# Patient Record
Sex: Female | Born: 1980 | Race: White | Hispanic: No | Marital: Married | Smoking: Never smoker
Health system: Southern US, Community
[De-identification: ages and names within clinical notes are randomized; demographics above are authoritative.]

## PROBLEM LIST (undated history)

## (undated) DIAGNOSIS — J45909 Unspecified asthma, uncomplicated: Secondary | ICD-10-CM

## (undated) DIAGNOSIS — I499 Cardiac arrhythmia, unspecified: Secondary | ICD-10-CM

## (undated) DIAGNOSIS — I471 Supraventricular tachycardia, unspecified: Secondary | ICD-10-CM

## (undated) DIAGNOSIS — R87619 Unspecified abnormal cytological findings in specimens from cervix uteri: Secondary | ICD-10-CM

## (undated) DIAGNOSIS — D72819 Decreased white blood cell count, unspecified: Secondary | ICD-10-CM

## (undated) DIAGNOSIS — M3500 Sicca syndrome, unspecified: Secondary | ICD-10-CM

## (undated) DIAGNOSIS — D6949 Other primary thrombocytopenia: Secondary | ICD-10-CM

## (undated) DIAGNOSIS — IMO0002 Reserved for concepts with insufficient information to code with codable children: Secondary | ICD-10-CM

## (undated) HISTORY — PX: CERVICAL BIOPSY: SHX590

## (undated) HISTORY — DX: Unspecified asthma, uncomplicated: J45.909

## (undated) HISTORY — DX: Sjogren syndrome, unspecified: M35.00

## (undated) HISTORY — PX: WISDOM TOOTH EXTRACTION: SHX21

## (undated) HISTORY — DX: Decreased white blood cell count, unspecified: D72.819

## (undated) HISTORY — DX: Cardiac arrhythmia, unspecified: I49.9

## (undated) HISTORY — PX: TONSILLECTOMY: SUR1361

## (undated) HISTORY — DX: Supraventricular tachycardia, unspecified: I47.10

---

## 2008-07-25 ENCOUNTER — Inpatient Hospital Stay (HOSPITAL_COMMUNITY): Admission: AD | Admit: 2008-07-25 | Discharge: 2008-07-28 | Payer: Self-pay | Admitting: Obstetrics and Gynecology

## 2010-06-27 ENCOUNTER — Ambulatory Visit: Payer: Self-pay | Admitting: Oncology

## 2010-06-30 LAB — CBC WITH DIFFERENTIAL/PLATELET
BASO%: 0.4 % (ref 0.0–2.0)
Basophils Absolute: 0 10*3/uL (ref 0.0–0.1)
EOS%: 0.1 % (ref 0.0–7.0)
Eosinophils Absolute: 0 10*3/uL (ref 0.0–0.5)
HCT: 37.9 % (ref 34.8–46.6)
HGB: 13 g/dL (ref 11.6–15.9)
LYMPH%: 44.3 % (ref 14.0–49.7)
MCH: 28.7 pg (ref 25.1–34.0)
MCHC: 34.3 g/dL (ref 31.5–36.0)
MCV: 83.7 fL (ref 79.5–101.0)
MONO#: 0.3 10*3/uL (ref 0.1–0.9)
MONO%: 11.5 % (ref 0.0–14.0)
NEUT#: 1.1 10*3/uL — ABNORMAL LOW (ref 1.5–6.5)
NEUT%: 43.7 % (ref 38.4–76.8)
Platelets: 141 10*3/uL — ABNORMAL LOW (ref 145–400)
RBC: 4.53 10*6/uL (ref 3.70–5.45)
RDW: 11.4 % (ref 11.2–14.5)
WBC: 2.4 10*3/uL — ABNORMAL LOW (ref 3.9–10.3)
lymph#: 1.1 10*3/uL (ref 0.9–3.3)

## 2010-06-30 LAB — FERRITIN: Ferritin: 37 ng/mL (ref 10–291)

## 2010-06-30 LAB — IRON AND TIBC
%SAT: 8 % — ABNORMAL LOW (ref 20–55)
Iron: 26 ug/dL — ABNORMAL LOW (ref 42–145)
TIBC: 324 ug/dL (ref 250–470)
UIBC: 298 ug/dL

## 2010-06-30 LAB — CHCC SMEAR

## 2010-06-30 LAB — COMPREHENSIVE METABOLIC PANEL
ALT: 17 U/L (ref 0–35)
AST: 17 U/L (ref 0–37)
Albumin: 4.1 g/dL (ref 3.5–5.2)
Alkaline Phosphatase: 35 U/L — ABNORMAL LOW (ref 39–117)
BUN: 11 mg/dL (ref 6–23)
CO2: 25 mEq/L (ref 19–32)
Calcium: 9.1 mg/dL (ref 8.4–10.5)
Chloride: 102 mEq/L (ref 96–112)
Creatinine, Ser: 0.83 mg/dL (ref 0.40–1.20)
Glucose, Bld: 93 mg/dL (ref 70–99)
Potassium: 3.9 mEq/L (ref 3.5–5.3)
Sodium: 136 mEq/L (ref 135–145)
Total Bilirubin: 0.5 mg/dL (ref 0.3–1.2)
Total Protein: 7.9 g/dL (ref 6.0–8.3)

## 2010-08-25 ENCOUNTER — Ambulatory Visit: Payer: Self-pay | Admitting: Oncology

## 2010-09-01 LAB — COMPREHENSIVE METABOLIC PANEL
ALT: 17 U/L (ref 0–35)
AST: 15 U/L (ref 0–37)
Albumin: 4.2 g/dL (ref 3.5–5.2)
Alkaline Phosphatase: 34 U/L — ABNORMAL LOW (ref 39–117)
BUN: 10 mg/dL (ref 6–23)
CO2: 24 mEq/L (ref 19–32)
Calcium: 9.3 mg/dL (ref 8.4–10.5)
Chloride: 102 mEq/L (ref 96–112)
Creatinine, Ser: 0.78 mg/dL (ref 0.40–1.20)
Glucose, Bld: 111 mg/dL — ABNORMAL HIGH (ref 70–99)
Potassium: 3.6 mEq/L (ref 3.5–5.3)
Sodium: 136 mEq/L (ref 135–145)
Total Bilirubin: 0.3 mg/dL (ref 0.3–1.2)
Total Protein: 8.4 g/dL — ABNORMAL HIGH (ref 6.0–8.3)

## 2010-09-01 LAB — CBC WITH DIFFERENTIAL/PLATELET
BASO%: 0.6 % (ref 0.0–2.0)
Basophils Absolute: 0 10*3/uL (ref 0.0–0.1)
EOS%: 0.2 % (ref 0.0–7.0)
Eosinophils Absolute: 0 10*3/uL (ref 0.0–0.5)
HCT: 36.7 % (ref 34.8–46.6)
HGB: 12.7 g/dL (ref 11.6–15.9)
LYMPH%: 41.1 % (ref 14.0–49.7)
MCH: 29 pg (ref 25.1–34.0)
MCHC: 34.6 g/dL (ref 31.5–36.0)
MCV: 83.7 fL (ref 79.5–101.0)
MONO#: 0.3 10*3/uL (ref 0.1–0.9)
MONO%: 9.2 % (ref 0.0–14.0)
NEUT#: 1.5 10*3/uL (ref 1.5–6.5)
NEUT%: 48.9 % (ref 38.4–76.8)
Platelets: 141 10*3/uL — ABNORMAL LOW (ref 145–400)
RBC: 4.38 10*6/uL (ref 3.70–5.45)
RDW: 12.3 % (ref 11.2–14.5)
WBC: 3 10*3/uL — ABNORMAL LOW (ref 3.9–10.3)
lymph#: 1.2 10*3/uL (ref 0.9–3.3)

## 2011-03-14 ENCOUNTER — Other Ambulatory Visit: Payer: Self-pay | Admitting: Oncology

## 2011-03-14 ENCOUNTER — Encounter (HOSPITAL_BASED_OUTPATIENT_CLINIC_OR_DEPARTMENT_OTHER): Payer: BC Managed Care – PPO | Admitting: Oncology

## 2011-03-14 DIAGNOSIS — D72819 Decreased white blood cell count, unspecified: Secondary | ICD-10-CM

## 2011-03-14 DIAGNOSIS — D696 Thrombocytopenia, unspecified: Secondary | ICD-10-CM

## 2011-03-14 DIAGNOSIS — D709 Neutropenia, unspecified: Secondary | ICD-10-CM

## 2011-03-14 LAB — CBC WITH DIFFERENTIAL/PLATELET
BASO%: 0.5 % (ref 0.0–2.0)
Basophils Absolute: 0 10*3/uL (ref 0.0–0.1)
EOS%: 1 % (ref 0.0–7.0)
Eosinophils Absolute: 0 10*3/uL (ref 0.0–0.5)
HCT: 33.8 % — ABNORMAL LOW (ref 34.8–46.6)
HGB: 11.7 g/dL (ref 11.6–15.9)
LYMPH%: 36.7 % (ref 14.0–49.7)
MCH: 29.2 pg (ref 25.1–34.0)
MCHC: 34.6 g/dL (ref 31.5–36.0)
MCV: 84.5 fL (ref 79.5–101.0)
MONO#: 0.4 10*3/uL (ref 0.1–0.9)
MONO%: 12.4 % (ref 0.0–14.0)
NEUT#: 1.4 10*3/uL — ABNORMAL LOW (ref 1.5–6.5)
NEUT%: 49.4 % (ref 38.4–76.8)
Platelets: 125 10*3/uL — ABNORMAL LOW (ref 145–400)
RBC: 4 10*6/uL (ref 3.70–5.45)
RDW: 12.4 % (ref 11.2–14.5)
WBC: 2.9 10*3/uL — ABNORMAL LOW (ref 3.9–10.3)
lymph#: 1.1 10*3/uL (ref 0.9–3.3)

## 2011-04-04 LAB — HIV ANTIBODY (ROUTINE TESTING W REFLEX): HIV: NONREACTIVE

## 2011-04-04 LAB — RPR: RPR: NONREACTIVE

## 2011-04-04 LAB — CULTURE, BETA STREP (GROUP B ONLY): Organism ID, Bacteria: NEGATIVE

## 2011-04-04 LAB — ANTIBODY SCREEN: Antibody Screen: NEGATIVE

## 2011-05-29 LAB — CBC
HCT: 33.3 % — ABNORMAL LOW (ref 36.0–46.0)
Hemoglobin: 11.5 g/dL — ABNORMAL LOW (ref 12.0–15.0)
MCHC: 34.5 g/dL (ref 30.0–36.0)
MCV: 90.8 fL (ref 78.0–100.0)
Platelets: 174 10*3/uL (ref 150–400)
RBC: 3.67 MIL/uL — ABNORMAL LOW (ref 3.87–5.11)
RDW: 12.8 % (ref 11.5–15.5)
WBC: 8.6 10*3/uL (ref 4.0–10.5)

## 2011-06-01 LAB — CBC
Hemoglobin: 10.8 g/dL — ABNORMAL LOW (ref 12.0–15.0)
MCHC: 34.5 g/dL (ref 30.0–36.0)
MCV: 90.4 fL (ref 78.0–100.0)
RBC: 3.45 MIL/uL — ABNORMAL LOW (ref 3.87–5.11)
WBC: 9.5 10*3/uL (ref 4.0–10.5)

## 2011-07-10 ENCOUNTER — Encounter: Payer: Self-pay | Admitting: *Deleted

## 2011-07-24 ENCOUNTER — Ambulatory Visit: Payer: BC Managed Care – PPO | Admitting: Oncology

## 2011-07-24 ENCOUNTER — Other Ambulatory Visit: Payer: BC Managed Care – PPO

## 2011-07-24 ENCOUNTER — Telehealth: Payer: Self-pay | Admitting: *Deleted

## 2011-07-27 ENCOUNTER — Telehealth: Payer: Self-pay | Admitting: Oncology

## 2011-07-27 NOTE — Telephone Encounter (Signed)
Returned pt's call and r/s 11/29 appt to 12*19 @ 3:30 pm.

## 2011-08-15 ENCOUNTER — Other Ambulatory Visit: Payer: Self-pay | Admitting: Oncology

## 2011-08-15 ENCOUNTER — Other Ambulatory Visit (HOSPITAL_BASED_OUTPATIENT_CLINIC_OR_DEPARTMENT_OTHER): Payer: BC Managed Care – PPO | Admitting: Lab

## 2011-08-15 ENCOUNTER — Ambulatory Visit (HOSPITAL_BASED_OUTPATIENT_CLINIC_OR_DEPARTMENT_OTHER): Payer: BC Managed Care – PPO | Admitting: Oncology

## 2011-08-15 VITALS — BP 115/71 | HR 75 | Temp 98.4°F | Ht 69.0 in | Wt 176.8 lb

## 2011-08-15 DIAGNOSIS — D709 Neutropenia, unspecified: Secondary | ICD-10-CM

## 2011-08-15 DIAGNOSIS — D72819 Decreased white blood cell count, unspecified: Secondary | ICD-10-CM

## 2011-08-15 DIAGNOSIS — Z331 Pregnant state, incidental: Secondary | ICD-10-CM

## 2011-08-15 DIAGNOSIS — D696 Thrombocytopenia, unspecified: Secondary | ICD-10-CM

## 2011-08-15 LAB — CBC WITH DIFFERENTIAL/PLATELET
BASO%: 0.5 % (ref 0.0–2.0)
Eosinophils Absolute: 0 10*3/uL (ref 0.0–0.5)
HCT: 31.5 % — ABNORMAL LOW (ref 34.8–46.6)
LYMPH%: 19.8 % (ref 14.0–49.7)
MCV: 88.6 fL (ref 79.5–101.0)
MONO%: 7.5 % (ref 0.0–14.0)
NEUT#: 4.1 10*3/uL (ref 1.5–6.5)
NEUT%: 71.9 % (ref 38.4–76.8)
Platelets: 150 10*3/uL (ref 145–400)
RBC: 3.56 10*6/uL — ABNORMAL LOW (ref 3.70–5.45)

## 2011-08-15 NOTE — Progress Notes (Signed)
Hematology and Oncology Follow Up Visit  Diane Lee [id number] Dec 25, 1980 29 y.o. 08/15/2011 4:26 PM    CC: Claude Manges, MD  [facility]. Marcelle Overlie, M.D.    Principle Diagnosis: A 30 year old female with mild neutropenia and thrombocytopenia likely of a benign etiology.   Interim History:  Ms. Macaluso presents today for a follow-up visit.  A very pleasant 30 year old with mild neutropenia that I followed her back in November 2011.  Her absolute neutrophil count was 1100.  It was normalized around 1500 in January 2012.  Her platelet count, it was also mildly low at 141.  Normal is 145.  Since the last time I saw her, she had not reported any major problems.  She does report now that she is about  6 months pregnant, and she is doing well.  She has not really had any health issues to report.  She does not report any illnesses.  Does not report any hospitalization.  Has not reported any fevers, has not reported any infections.  Overall, has not had any bleeding.  No problems to be reported at this time.  Medications: I have reviewed the patient's current medications. Current outpatient prescriptions:Prenatal Vit-Fe Fumarate-FA (PRENATAL MULTIVITAMIN) 60-1 MG tablet, Take 1 tablet by mouth daily.  , Disp: , Rfl:   Allergies: Allergies no known allergies  Past Medical History, Surgical history, Social history, and Family History were reviewed and updated.  Review of Systems: Constitutional:  Negative for fever, chills, night sweats, anorexia, weight loss, pain. Cardiovascular: no chest pain or dyspnea on exertion Respiratory: no cough, shortness of breath, or wheezing Neurological: no TIA or stroke symptoms Dermatological: negative ENT: negative Skin: Negative. Gastrointestinal: no abdominal pain, change in bowel habits, or black or bloody stools Genito-Urinary: no dysuria, trouble voiding, or hematuria Hematological and Lymphatic: negative Breast: negative Musculoskeletal:  negative Remaining ROS negative. Physical Exam: Blood pressure 115/71, pulse 75, temperature 98.4 F (36.9 C), temperature source Oral, height 5\' 9"  (1.753 m), weight 176 lb 12.8 oz (80.196 kg). ECOG: 0 General appearance: alert Head: Normocephalic, without obvious abnormality, atraumatic Neck: no adenopathy, no carotid bruit, no JVD, supple, symmetrical, trachea midline and thyroid not enlarged, symmetric, no tenderness/mass/nodules Lymph nodes: Cervical, supraclavicular, and axillary nodes normal. Heart:regular rate and rhythm, S1, S2 normal, no murmur, click, rub or gallop Lung:chest clear, no wheezing, rales, normal symmetric air entry Abdomin: soft, non-tender, without masses or organomegaly EXT:no erythema, induration, or nodules   Lab Results: Lab Results  Component Value Date   WBC 5.7 08/15/2011   HGB 10.8* 08/15/2011   HCT 31.5* 08/15/2011   MCV 88.6 08/15/2011   PLT 150 08/15/2011     Chemistry      Component Value Date/Time   NA 136 09/01/2010 1436   K 3.6 09/01/2010 1436   CL 102 09/01/2010 1436   CO2 24 09/01/2010 1436   BUN 10 09/01/2010 1436   CREATININE 0.78 09/01/2010 1436      Component Value Date/Time   CALCIUM 9.3 09/01/2010 1436   ALKPHOS 34* 09/01/2010 1436   AST 15 09/01/2010 1436   ALT 17 09/01/2010 1436   BILITOT 0.3 09/01/2010 1436        Impression and Plan:   A 30 year old female with the following issues: 1. Mild neutropenia.  Again, her absolute neutrophil count is normal at this point. No further intervention or work up is needed. 2. Thrombocytopenia.  Again, her platelet counts have been normal.  She did not have a drop  since the last time I saw her. Her platelet count is more than adequate for delivery and anesthesia.  3. Follow up will be as needed.    [facility], MD 12/19/20124:26 PM

## 2011-08-28 NOTE — L&D Delivery Note (Signed)
Delivery Note At 7:50 PM a viable female was delivered via  oa.  APGAR:9 9 weight pending Placenta status: spontaneously with 3 v cord and intact.  Anesthesia:  epidural Episiotomy: none Lacerations: 2nd Suture Repair: 3.0 chromic Est. Blood Loss (mL): 300  Mom to postpartum.  Baby to nursery-stable.  Lavonya Hoerner L 10/29/2011, 7:59 PM

## 2011-10-29 ENCOUNTER — Encounter (HOSPITAL_COMMUNITY): Payer: Self-pay | Admitting: Anesthesiology

## 2011-10-29 ENCOUNTER — Inpatient Hospital Stay (HOSPITAL_COMMUNITY)
Admission: AD | Admit: 2011-10-29 | Discharge: 2011-10-31 | DRG: 373 | Disposition: A | Discharge status: Home / Self Care | Payer: BC Managed Care – PPO | Source: Ambulatory Visit | Attending: Obstetrics and Gynecology | Admitting: Obstetrics and Gynecology

## 2011-10-29 ENCOUNTER — Encounter (HOSPITAL_COMMUNITY): Payer: Self-pay

## 2011-10-29 ENCOUNTER — Inpatient Hospital Stay (HOSPITAL_COMMUNITY): Payer: BC Managed Care – PPO | Admitting: Anesthesiology

## 2011-10-29 DIAGNOSIS — O878 Other venous complications in the puerperium: Secondary | ICD-10-CM | POA: Diagnosis present

## 2011-10-29 DIAGNOSIS — K649 Unspecified hemorrhoids: Secondary | ICD-10-CM | POA: Diagnosis present

## 2011-10-29 HISTORY — DX: Reserved for concepts with insufficient information to code with codable children: IMO0002

## 2011-10-29 HISTORY — DX: Other primary thrombocytopenia: D69.49

## 2011-10-29 HISTORY — DX: Unspecified abnormal cytological findings in specimens from cervix uteri: R87.619

## 2011-10-29 LAB — RUBELLA SCREEN: Rubella: 71 IU/mL — ABNORMAL HIGH

## 2011-10-29 LAB — CBC
HCT: 33.2 % — ABNORMAL LOW (ref 36.0–46.0)
Hemoglobin: 11.3 g/dL — ABNORMAL LOW (ref 12.0–15.0)
WBC: 9.7 10*3/uL (ref 4.0–10.5)

## 2011-10-29 MED ORDER — AZITHROMYCIN 250 MG PO TABS
250.0000 mg | ORAL_TABLET | Freq: Every day | ORAL | Status: DC
  Filled 2011-10-29 (×3): qty 1

## 2011-10-29 MED ORDER — ONDANSETRON HCL 4 MG/2ML IJ SOLN: 4.0000 mg | INTRAMUSCULAR | Status: DC | PRN

## 2011-10-29 MED ORDER — MEASLES, MUMPS & RUBELLA VAC ~~LOC~~ INJ
0.5000 mL | INJECTION | Freq: Once | SUBCUTANEOUS | Status: DC
Start: 2011-10-30 — End: 2011-10-31

## 2011-10-29 MED ORDER — SENNOSIDES-DOCUSATE SODIUM 8.6-50 MG PO TABS
2.0000 | ORAL_TABLET | Freq: Every day | ORAL | Status: DC
  Administered 2011-10-30: 2 via ORAL

## 2011-10-29 MED ORDER — LIDOCAINE HCL (PF) 1 % IJ SOLN
30.0000 mL | INTRAMUSCULAR | Status: DC | PRN
  Filled 2011-10-29: qty 30

## 2011-10-29 MED ORDER — ZOLPIDEM TARTRATE 5 MG PO TABS: 5.0000 mg | ORAL_TABLET | Freq: Every evening | ORAL | Status: DC | PRN

## 2011-10-29 MED ORDER — TETANUS-DIPHTH-ACELL PERTUSSIS 5-2.5-18.5 LF-MCG/0.5 IM SUSP: 0.5000 mL | Freq: Once | INTRAMUSCULAR | Status: DC

## 2011-10-29 MED ORDER — OXYCODONE-ACETAMINOPHEN 5-325 MG PO TABS
1.0000 | ORAL_TABLET | ORAL | Status: DC | PRN
  Administered 2011-10-30 – 2011-10-31 (×5): 1 via ORAL
  Filled 2011-10-29 (×5): qty 1

## 2011-10-29 MED ORDER — FLEET ENEMA 7-19 GM/118ML RE ENEM: 1.0000 | ENEMA | RECTAL | Status: DC | PRN

## 2011-10-29 MED ORDER — DIPHENHYDRAMINE HCL 50 MG/ML IJ SOLN: 12.5000 mg | INTRAMUSCULAR | Status: DC | PRN

## 2011-10-29 MED ORDER — FENTANYL 2.5 MCG/ML BUPIVACAINE 1/10 % EPIDURAL INFUSION (WH - ANES)
14.0000 mL/h | INTRAMUSCULAR | Status: DC
  Administered 2011-10-29: 14 mL/h via EPIDURAL
  Filled 2011-10-29: qty 60

## 2011-10-29 MED ORDER — ONDANSETRON HCL 4 MG/2ML IJ SOLN: 4.0000 mg | Freq: Four times a day (QID) | INTRAMUSCULAR | Status: DC | PRN

## 2011-10-29 MED ORDER — OXYCODONE-ACETAMINOPHEN 5-325 MG PO TABS: 1.0000 | ORAL_TABLET | ORAL | Status: DC | PRN

## 2011-10-29 MED ORDER — LACTATED RINGERS IV SOLN: 500.0000 mL | INTRAVENOUS | Status: DC | PRN

## 2011-10-29 MED ORDER — DIPHENHYDRAMINE HCL 25 MG PO CAPS: 25.0000 mg | ORAL_CAPSULE | Freq: Four times a day (QID) | ORAL | Status: DC | PRN

## 2011-10-29 MED ORDER — EPHEDRINE 5 MG/ML INJ
10.0000 mg | INTRAVENOUS | Status: DC | PRN
  Filled 2011-10-29: qty 4

## 2011-10-29 MED ORDER — BENZOCAINE-MENTHOL 20-0.5 % EX AERO
INHALATION_SPRAY | CUTANEOUS | Status: AC
  Filled 2011-10-29: qty 56

## 2011-10-29 MED ORDER — DIBUCAINE 1 % RE OINT
1.0000 "application " | TOPICAL_OINTMENT | RECTAL | Status: DC | PRN
  Administered 2011-10-29: 1 via RECTAL
  Filled 2011-10-29: qty 28

## 2011-10-29 MED ORDER — PHENYLEPHRINE 40 MCG/ML (10ML) SYRINGE FOR IV PUSH (FOR BLOOD PRESSURE SUPPORT): 80.0000 ug | PREFILLED_SYRINGE | INTRAVENOUS | Status: DC | PRN

## 2011-10-29 MED ORDER — LANOLIN HYDROUS EX OINT: TOPICAL_OINTMENT | CUTANEOUS | Status: DC | PRN

## 2011-10-29 MED ORDER — IBUPROFEN 600 MG PO TABS: 600.0000 mg | ORAL_TABLET | Freq: Four times a day (QID) | ORAL | Status: DC | PRN

## 2011-10-29 MED ORDER — LACTATED RINGERS IV SOLN
500.0000 mL | Freq: Once | INTRAVENOUS | Status: AC
  Administered 2011-10-29: 1000 mL via INTRAVENOUS

## 2011-10-29 MED ORDER — ONDANSETRON HCL 4 MG PO TABS: 4.0000 mg | ORAL_TABLET | ORAL | Status: DC | PRN

## 2011-10-29 MED ORDER — MEDROXYPROGESTERONE ACETATE 150 MG/ML IM SUSP: 150.0000 mg | INTRAMUSCULAR | Status: DC | PRN

## 2011-10-29 MED ORDER — EPHEDRINE 5 MG/ML INJ: 10.0000 mg | INTRAVENOUS | Status: DC | PRN

## 2011-10-29 MED ORDER — WITCH HAZEL-GLYCERIN EX PADS
1.0000 "application " | MEDICATED_PAD | CUTANEOUS | Status: DC | PRN
  Administered 2011-10-29: 1 via TOPICAL

## 2011-10-29 MED ORDER — FLEET ENEMA 7-19 GM/118ML RE ENEM: 1.0000 | ENEMA | Freq: Every day | RECTAL | Status: DC | PRN

## 2011-10-29 MED ORDER — SIMETHICONE 80 MG PO CHEW: 80.0000 mg | CHEWABLE_TABLET | ORAL | Status: DC | PRN

## 2011-10-29 MED ORDER — IBUPROFEN 600 MG PO TABS
600.0000 mg | ORAL_TABLET | Freq: Four times a day (QID) | ORAL | Status: DC
  Administered 2011-10-29 – 2011-10-31 (×7): 600 mg via ORAL
  Filled 2011-10-29 (×7): qty 1

## 2011-10-29 MED ORDER — BISACODYL 10 MG RE SUPP: 10.0000 mg | Freq: Every day | RECTAL | Status: DC | PRN

## 2011-10-29 MED ORDER — ACETAMINOPHEN 325 MG PO TABS: 650.0000 mg | ORAL_TABLET | ORAL | Status: DC | PRN

## 2011-10-29 MED ORDER — PRENATAL MULTIVITAMIN CH
1.0000 | ORAL_TABLET | Freq: Every day | ORAL | Status: DC
  Administered 2011-10-30 – 2011-10-31 (×2): 1 via ORAL
  Filled 2011-10-29 (×2): qty 1

## 2011-10-29 MED ORDER — BENZOCAINE-MENTHOL 20-0.5 % EX AERO: 1.0000 "application " | INHALATION_SPRAY | CUTANEOUS | Status: DC | PRN

## 2011-10-29 MED ORDER — PHENYLEPHRINE 40 MCG/ML (10ML) SYRINGE FOR IV PUSH (FOR BLOOD PRESSURE SUPPORT)
80.0000 ug | PREFILLED_SYRINGE | INTRAVENOUS | Status: DC | PRN
  Filled 2011-10-29: qty 5

## 2011-10-29 MED ORDER — OXYTOCIN 20 UNITS IN LACTATED RINGERS INFUSION - SIMPLE
125.0000 mL/h | Freq: Once | INTRAVENOUS | Status: AC
  Administered 2011-10-29: 999 mL/h via INTRAVENOUS

## 2011-10-29 MED ORDER — LIDOCAINE HCL (PF) 1 % IJ SOLN
INTRAMUSCULAR | Status: DC | PRN
  Administered 2011-10-29 (×3): 4 mL
  Administered 2011-10-29: 30 mL
  Administered 2011-10-29: 4 mL

## 2011-10-29 MED ORDER — CITRIC ACID-SODIUM CITRATE 334-500 MG/5ML PO SOLN: 30.0000 mL | ORAL | Status: DC | PRN

## 2011-10-29 MED ORDER — OXYTOCIN BOLUS FROM INFUSION
500.0000 mL | Freq: Once | INTRAVENOUS | Status: DC
  Filled 2011-10-29: qty 500
  Filled 2011-10-29: qty 1000

## 2011-10-29 MED ORDER — LACTATED RINGERS IV SOLN
INTRAVENOUS | Status: DC
  Administered 2011-10-29: 125 mL/h via INTRAVENOUS
  Administered 2011-10-29: 17:00:00 via INTRAVENOUS

## 2011-10-29 NOTE — Progress Notes (Signed)
Pt states was 2cm in office last week, ctx's began at 0830, worsened at 1000, now averaging q4-6 minutes apart, rates 6-7/10. Denies bleeding or lof. +FM.

## 2011-10-29 NOTE — H&P (Signed)
31 year old G 2 P 1 at 18 w 5 days presents in active labor. Prenatal care see [facility] GBBS is negative History of 1 NSVD History of borderline thrombocytopenia  Afebrile Vital signs stable Fetal heart rate is reactive toco contractions every 3 minutes  Lung CTAB Car RRR Abdomen is soft and non tender Cervix is now 90%/ 7 to 8 -2 Vertex  IMPRESSION: Active labor  PLAN: Epidural if platelets ok AROM

## 2011-10-29 NOTE — Anesthesia Procedure Notes (Signed)
Epidural Patient location during procedure: OB Start time: 10/29/2011 6:05 PM Reason for block: procedure for pain  Staffing Performed by: anesthesiologist   Preanesthetic Checklist Completed: patient identified, site marked, surgical consent, pre-op evaluation, timeout performed, IV checked, risks and benefits discussed and monitors and equipment checked  Epidural Patient position: sitting Prep: site prepped and draped and DuraPrep Patient monitoring: continuous pulse ox and blood pressure Approach: midline Injection technique: LOR air  Needle:  Needle type: Tuohy  Needle gauge: 17 G Needle length: 9 cm Needle insertion depth: 5 cm cm Catheter type: closed end flexible Catheter size: 19 Gauge Catheter at skin depth: 10 cm Test dose: negative  Assessment Events: blood not aspirated, injection not painful, no injection resistance, negative IV test and no paresthesia  Additional Notes Discussed risk of headache, infection, bleeding, nerve injury and failed or incomplete block.  Patient voices understanding and wishes to proceed.

## 2011-10-29 NOTE — Anesthesia Preprocedure Evaluation (Signed)

## 2011-10-29 NOTE — Progress Notes (Signed)
NSVD of a viable female.  

## 2011-10-30 LAB — CBC
HCT: 31.4 % — ABNORMAL LOW (ref 36.0–46.0)
Hemoglobin: 10.6 g/dL — ABNORMAL LOW (ref 12.0–15.0)
MCHC: 33.8 g/dL (ref 30.0–36.0)

## 2011-10-30 MED ORDER — PSEUDOEPHEDRINE HCL 30 MG PO TABS
60.0000 mg | ORAL_TABLET | Freq: Four times a day (QID) | ORAL | Status: DC | PRN
  Administered 2011-10-30: 60 mg via ORAL
  Filled 2011-10-30: qty 2

## 2011-10-30 NOTE — Anesthesia Postprocedure Evaluation (Signed)
  Anesthesia Post-op Note  Patient: Diane Lee  Procedure(s) Performed: * No surgery found *  Patient Location: PACU  Anesthesia Type: Epidural  Level of Consciousness: awake  Airway and Oxygen Therapy: Patient Spontanous Breathing  Post-op Pain: none  Post-op Assessment: Patient's Cardiovascular Status Stable  Post-op Vital Signs: Reviewed and stable  Complications: No apparent anesthesia complications

## 2011-10-30 NOTE — Progress Notes (Signed)
Post Partum Day 1 Subjective: no complaints, up ad lib, voiding and tolerating PO  Objective: Blood pressure 101/68, pulse 91, temperature 97.2 F (36.2 C), temperature source Oral, resp. rate 18, height 5' 9.5" (1.765 m), weight 87.261 kg (192 lb 6 oz), SpO2 99.00%, unknown if currently breastfeeding.  Physical Exam:  General: alert and cooperative Lochia: appropriate Uterine Fundus: firm Perineum intact DVT Evaluation: No evidence of DVT seen on physical exam.   Basename 10/30/11 0520 10/29/11 1707  HGB 10.6* 11.3*  HCT 31.4* 33.2*    Assessment/Plan: Plan for discharge tomorrow   LOS: 1 day   CURTIS,CAROL G 10/30/2011, 8:13 AM

## 2011-10-30 NOTE — Anesthesia Postprocedure Evaluation (Signed)
  Anesthesia Post-op Note  Patient: Diane Lee  Procedure(s) Performed: * No surgery found *  Patient Location: Mother/Baby  Anesthesia Type: Epidural  Level of Consciousness: awake  Airway and Oxygen Therapy: Patient Spontanous Breathing  Post-op Pain: none  Post-op Assessment: Patient's Cardiovascular Status Stable, Respiratory Function Stable, No signs of Nausea or vomiting, Adequate PO intake, Pain level controlled, No headache, No backache, No residual numbness and No residual motor weakness  Post-op Vital Signs: Reviewed and stable  Complications: No apparent anesthesia complications

## 2011-10-31 MED ORDER — HYDROCORTISONE ACE-PRAMOXINE 1-1 % RE FOAM: 1.0000 | Freq: Two times a day (BID) | RECTAL | Status: DC

## 2011-10-31 MED ORDER — HYDROCORTISONE ACE-PRAMOXINE 1-1 % RE FOAM: 1.0000 | Freq: Two times a day (BID) | RECTAL | Status: AC

## 2011-10-31 MED ORDER — IBUPROFEN 600 MG PO TABS: 600.0000 mg | ORAL_TABLET | Freq: Four times a day (QID) | ORAL | Status: AC

## 2011-10-31 MED ORDER — OXYCODONE-ACETAMINOPHEN 5-325 MG PO TABS: 1.0000 | ORAL_TABLET | ORAL | Status: AC | PRN

## 2011-10-31 NOTE — Discharge Summary (Signed)
Obstetric Discharge Summary Reason for Admission: onset of labor Prenatal Procedures: ultrasound Intrapartum Procedures: spontaneous vaginal delivery Postpartum Procedures: none Complications-Operative and Postpartum: 2 degree perineal laceration HGB  Date Value Range Status  08/15/2011 10.8* 11.6-15.9 (g/dL) Final     Hemoglobin  Date Value Range Status  10/30/2011 10.6* 12.0-15.0 (g/dL) Final     HCT  Date Value Range Status  10/30/2011 31.4* 36.0-46.0 (%) Final  08/15/2011 31.5* 34.8-46.6 (%) Final    Discharge Diagnoses: Term Pregnancy-delivered  Discharge Information: Date: 10/31/2011 Activity: pelvic rest Diet: routine Medications: PNV, Ibuprofen and Percocet Condition: stable Instructions: refer to practice specific booklet Discharge to: home   Newborn Data: Live born female  Birth Weight: 7 lb 12.7 oz (3535 g) APGAR: 9, 9  Home with mother.  Collette Pescador G 10/31/2011, 8:38 AM

## 2011-10-31 NOTE — Progress Notes (Signed)
Post Partum Day 2 Subjective: no complaints, up ad lib, voiding, tolerating PO and + flatus  Objective: Blood pressure 100/70, pulse 92, temperature 97.6 F (36.4 C), temperature source Oral, resp. rate 19, height 5' 9.5" (1.765 m), weight 87.261 kg (192 lb 6 oz), SpO2 99.00%, unknown if currently breastfeeding.  Physical Exam:  General: alert and cooperative Lochia: appropriate Uterine Fundus: firm Perineum intact ,small hemorrhoids DVT Evaluation: No evidence of DVT seen on physical exam.   Basename 10/30/11 0520 10/29/11 1707  HGB 10.6* 11.3*  HCT 31.4* 33.2*    Assessment/Plan: Discharge home   LOS: 2 days   Jabre Heo G 10/31/2011, 8:25 AM

## 2011-11-23 ENCOUNTER — Inpatient Hospital Stay (HOSPITAL_COMMUNITY)
Admission: AD | Admit: 2011-11-23 | Discharge: 2011-11-23 | Disposition: A | Discharge status: Home / Self Care | Payer: BC Managed Care – PPO | Source: Ambulatory Visit | Attending: Obstetrics and Gynecology | Admitting: Obstetrics and Gynecology

## 2011-11-23 ENCOUNTER — Encounter (HOSPITAL_COMMUNITY): Payer: Self-pay | Admitting: *Deleted

## 2011-11-23 DIAGNOSIS — N898 Other specified noninflammatory disorders of vagina: Secondary | ICD-10-CM

## 2011-11-23 DIAGNOSIS — N939 Abnormal uterine and vaginal bleeding, unspecified: Secondary | ICD-10-CM

## 2011-11-23 LAB — CBC
MCH: 29.3 pg (ref 26.0–34.0)
MCV: 88.5 fL (ref 78.0–100.0)
Platelets: 185 10*3/uL (ref 150–400)
RBC: 4.1 MIL/uL (ref 3.87–5.11)
RDW: 12.5 % (ref 11.5–15.5)
WBC: 4.6 10*3/uL (ref 4.0–10.5)

## 2011-11-23 NOTE — MAU Note (Signed)
Pt states her bleeding had slowed after delivery, was peach in color, 2 days ago bleeding became bright red again & passed some clots.  Last night pt had episode of bleeding in floor of BR.  States bleeding this a.m., is light, still bright red, has changed one pad this a.m.

## 2011-11-23 NOTE — MAU Provider Note (Signed)
  History     CSN: [id number]  Arrival date and time: 11/23/11 4098   First Provider Initiated Contact with Patient 11/23/11 1032      Chief Complaint  Patient presents with  . Vaginal Bleeding   HPI  Pt states her bleeding had slowed after delivery, was peach in color, 2 days ago bleeding became bright red again & passed some clots. Last night pt had episode of bleeding in floor of BR. States bleeding this a.m., is light, still bright red, has changed one pad this a.m.  Passed two quarter sized clots two days ago; passed an orange sized clot last night.  Denies pelvic pain.  +breastfeeding.   Past Medical History  Diagnosis Date  . Abnormal Pap smear   . Thrombocytopenic, cyclic     Was borderline, has been cleared by MD    Past Surgical History  Procedure Date  . Cervical biopsy     Benign  . Tonsillectomy   . Wisdom tooth extraction   . Vaginal delivery     History reviewed. No pertinent family history.  History  Substance Use Topics  . Smoking status: Never Smoker   . Smokeless tobacco: Never Used  . Alcohol Use: No    Allergies: No Known Allergies  Prescriptions prior to admission  Medication Sig Dispense Refill  . Prenatal Vit-Fe Fumarate-FA (PRENATAL MULTIVITAMIN) 60-1 MG tablet Take 1 tablet by mouth daily.          Review of Systems  Constitutional: Negative for malaise/fatigue.  Respiratory: Negative.   Cardiovascular: Negative.   Gastrointestinal: Negative for abdominal pain.  Genitourinary: Negative.   Neurological: Negative for dizziness and weakness.   Physical Exam   Blood pressure 115/75, pulse 104, temperature 97.5 F (36.4 C), temperature source Oral, resp. rate 16, currently breastfeeding.  Physical Exam  Constitutional: She is oriented to person, place, and time. She appears well-developed and well-nourished.  HENT:  Head: Normocephalic.  Neck: Normal range of motion. Neck supple.  Cardiovascular: Normal rate, regular rhythm and  normal heart sounds.   Respiratory: Effort normal and breath sounds normal.  GI: There is no rebound and no guarding.  Genitourinary: There is bleeding (scant; dark red; no bright red or brisk bleed) around the vagina. Vaginal discharge: vaginal bleeding.  Neurological: She is alert and oriented to person, place, and time. She has normal reflexes.  Skin: Skin is warm and dry.    MAU Course  Procedures  Results for orders placed during the hospital encounter of 11/23/11 (from the past 24 hour(s))  CBC     Status: Normal   Collection Time   11/23/11 11:00 AM      Component Value Range   WBC 4.6  4.0 - 10.5 (K/uL)   RBC 4.10  3.87 - 5.11 (MIL/uL)   Hemoglobin 12.0  12.0 - 15.0 (g/dL)   HCT 11.9  14.7 - 82.9 (%)   MCV 88.5  78.0 - 100.0 (fL)   MCH 29.3  26.0 - 34.0 (pg)   MCHC 33.1  30.0 - 36.0 (g/dL)   RDW 56.2  13.0 - 86.5 (%)   Platelets 185  150 - 400 (K/uL)    Report given to Dr. Marcelle Overlie; reviewed HPI/exam/cbc>discharge home with bleeding precautions and reassurance.  Assessment and Plan  Postpartum Bleeding  Plan: DC to home Provide reassurance Call if bleeding worsens  [facility] 11/23/2011, 10:34 AM

## 2011-11-23 NOTE — MAU Note (Signed)
Has passed some clots two nights, last night had gush of blood with clots, vaginal birth on 10/29/11

## 2013-02-09 DIAGNOSIS — IMO0002 Reserved for concepts with insufficient information to code with codable children: Secondary | ICD-10-CM | POA: Insufficient documentation

## 2013-02-09 DIAGNOSIS — D6949 Other primary thrombocytopenia: Secondary | ICD-10-CM | POA: Insufficient documentation

## 2013-02-10 ENCOUNTER — Ambulatory Visit: Payer: BC Managed Care – PPO | Admitting: Cardiovascular Disease

## 2013-04-28 ENCOUNTER — Ambulatory Visit: Payer: BC Managed Care – PPO | Admitting: Cardiology

## 2013-07-07 ENCOUNTER — Ambulatory Visit (INDEPENDENT_AMBULATORY_CARE_PROVIDER_SITE_OTHER): Payer: BC Managed Care – PPO | Admitting: Family Medicine

## 2013-07-07 DIAGNOSIS — Z23 Encounter for immunization: Secondary | ICD-10-CM

## 2014-06-28 ENCOUNTER — Encounter (HOSPITAL_COMMUNITY): Payer: Self-pay | Admitting: *Deleted

## 2014-07-13 ENCOUNTER — Ambulatory Visit: Payer: BC Managed Care – PPO

## 2014-07-20 ENCOUNTER — Ambulatory Visit (INDEPENDENT_AMBULATORY_CARE_PROVIDER_SITE_OTHER): Payer: BC Managed Care – PPO | Admitting: *Deleted

## 2014-07-20 DIAGNOSIS — Z23 Encounter for immunization: Secondary | ICD-10-CM

## 2014-07-20 NOTE — Progress Notes (Signed)
Patient ID: Diane Lee, female   DOB: May 19, 1981, 33 y.o.   MRN: [id number] Patient seen in office for Influenza Vaccination.   Tolerated IM administration well.   Immunization history updated.

## 2014-07-29 ENCOUNTER — Ambulatory Visit (INDEPENDENT_AMBULATORY_CARE_PROVIDER_SITE_OTHER): Payer: BC Managed Care – PPO | Admitting: Physician Assistant

## 2014-07-29 ENCOUNTER — Encounter: Payer: Self-pay | Admitting: Physician Assistant

## 2014-07-29 VITALS — BP 124/78 | HR 92 | Temp 98.3°F | Resp 20 | Wt 158.0 lb

## 2014-07-29 DIAGNOSIS — B9689 Other specified bacterial agents as the cause of diseases classified elsewhere: Principal | ICD-10-CM

## 2014-07-29 DIAGNOSIS — J988 Other specified respiratory disorders: Secondary | ICD-10-CM

## 2014-07-29 MED ORDER — AZITHROMYCIN 250 MG PO TABS: ORAL_TABLET | ORAL | Status: DC

## 2014-07-29 NOTE — Progress Notes (Signed)
    Patient ID: Diane Lee MRN: [id number], DOB: 09-23-1980, 33 y.o. Date of Encounter: 07/29/2014, 3:18 PM    Chief Complaint:  Chief Complaint  Patient presents with  . persistant cough over one month    getting worse, more deep, using otc Delsym     HPI: 33 y.o. year old white female reports that she has had this cough for about 5 weeks now. Says that she has seen no medical provider for this and has not been on any antibiotics. Says that it Seeming like it was getting better but then would come back and get worse again ---that's why she waited to be evaluated.   Had no mucus or drainage from the nose. Has felt a little bit of postnasal drainage. Has had no sore throat no ear ache and no fevers or chills.     Home Meds:   Outpatient Prescriptions Prior to Visit  Medication Sig Dispense Refill  . acetaminophen (TYLENOL) 500 MG tablet Take 1,000 mg by mouth daily as needed. For pain    . Prenatal Vit-Fe Fumarate-FA (PRENATAL MULTIVITAMIN) 60-1 MG tablet Take 1 tablet by mouth daily.       No facility-administered medications prior to visit.    Allergies:  Allergies  Allergen Reactions  . Bactrim Other (See Comments)    Pt states that this med causes joint pain, and her white blood cells and platelets to drop.      Review of Systems: See HPI for pertinent ROS. All other ROS negative.    Physical Exam: Blood pressure 124/78, pulse 92, temperature 98.3 F (36.8 C), temperature source Oral, resp. rate 20, weight 158 lb (71.668 kg), last menstrual period 07/04/2014, not currently breastfeeding., Body mass index is 23.01 kg/(m^2). General:  WNWD WF. Appears in no acute distress. HEENT: Normocephalic, atraumatic, eyes without discharge, sclera non-icteric, nares are without discharge. Bilateral auditory canals clear, TM's are without perforation, pearly grey and translucent with reflective cone of light bilaterally. Oral cavity moist, posterior pharynx without exudate,  erythema, peritonsillar abscess. No tenderness with percussion of frontal or maxillary sinuses bilaterally.  Neck: Supple. No thyromegaly. No lymphadenopathy. Lungs: Clear bilaterally to auscultation without wheezes, rales, or rhonchi. Breathing is unlabored. Heart: Regular rhythm. No murmurs, rubs, or gallops. Msk:  Strength and tone normal for age. Extremities/Skin: Warm and dry. Neuro: Alert and oriented X 3. Moves all extremities spontaneously. Gait is normal. CNII-XII grossly in tact. Psych:  Responds to questions appropriately with a normal affect.     ASSESSMENT AND PLAN:  33 y.o. year old female with  1. Bacterial respiratory infection - azithromycin (ZITHROMAX) 250 MG tablet; Day 1: Take 2 daily. Days 2-5: Take 1 daily.  Dispense: 6 tablet; Refill: 0 Take antibiotic as directed. Can use over-the-counter cough medications as needed for symptom relief in the interim. Follow-up if symptoms do not resolve within 1 week after completion of antibiotic.  Murray HodgkinsSigned, Manasvini Whatley Beth [place], [place], Ms [facility] 07/29/2014 3:18 PM

## 2014-08-09 ENCOUNTER — Ambulatory Visit: Payer: BC Managed Care – PPO | Admitting: Physician Assistant

## 2014-08-23 ENCOUNTER — Other Ambulatory Visit: Payer: BC Managed Care – PPO

## 2014-08-23 DIAGNOSIS — Z0000 Encounter for general adult medical examination without abnormal findings: Secondary | ICD-10-CM

## 2014-08-23 LAB — COMPLETE METABOLIC PANEL WITH GFR
ALK PHOS: 41 U/L (ref 39–117)
ALT: 34 U/L (ref 0–35)
AST: 23 U/L (ref 0–37)
Albumin: 4.1 g/dL (ref 3.5–5.2)
BILIRUBIN TOTAL: 0.6 mg/dL (ref 0.2–1.2)
BUN: 11 mg/dL (ref 6–23)
CALCIUM: 9.3 mg/dL (ref 8.4–10.5)
CHLORIDE: 105 meq/L (ref 96–112)
CO2: 26 mEq/L (ref 19–32)
CREATININE: 0.71 mg/dL (ref 0.50–1.10)
GFR, Est African American: >89 mL/min
GFR, Est Non African American: >89 mL/min
Glucose, Bld: 91 mg/dL (ref 70–99)
POTASSIUM: 4.1 meq/L (ref 3.5–5.3)
SODIUM: 135 meq/L (ref 135–145)
Total Protein: 7.9 g/dL (ref 6.0–8.3)

## 2014-08-23 LAB — LIPID PANEL
Cholesterol: 128 mg/dL (ref 0–200)
HDL: 41 mg/dL (ref >39)
LDL Cholesterol: 74 mg/dL (ref 0–99)
Total CHOL/HDL Ratio: 3.1 Ratio
Triglycerides: 64 mg/dL (ref <150)
VLDL: 13 mg/dL (ref 0–40)

## 2014-08-23 LAB — CBC WITH DIFFERENTIAL/PLATELET
BASOS ABS: 0 10*3/uL (ref 0.0–0.1)
BASOS PCT: 0 % (ref 0–1)
EOS ABS: 0 10*3/uL (ref 0.0–0.7)
EOS PCT: 0 % (ref 0–5)
HCT: 37.1 % (ref 36.0–46.0)
Hemoglobin: 12.4 g/dL (ref 12.0–15.0)
Lymphocytes Relative: 39 % (ref 12–46)
Lymphs Abs: 1.2 10*3/uL (ref 0.7–4.0)
MCH: 27.9 pg (ref 26.0–34.0)
MCHC: 33.4 g/dL (ref 30.0–36.0)
MCV: 83.4 fL (ref 78.0–100.0)
MONO ABS: 0.4 10*3/uL (ref 0.1–1.0)
MPV: 9.7 fL (ref 9.4–12.4)
Monocytes Relative: 14 % — ABNORMAL HIGH (ref 3–12)
Neutro Abs: 1.4 10*3/uL — ABNORMAL LOW (ref 1.7–7.7)
Neutrophils Relative %: 47 % (ref 43–77)
Platelets: 152 10*3/uL (ref 150–400)
RBC: 4.45 MIL/uL (ref 3.87–5.11)
RDW: 12.8 % (ref 11.5–15.5)
WBC: 3 10*3/uL — AB (ref 4.0–10.5)

## 2014-08-23 LAB — TSH: TSH: 1.843 u[IU]/mL (ref 0.350–4.500)

## 2014-08-24 ENCOUNTER — Encounter: Payer: Self-pay | Admitting: Family Medicine

## 2014-08-24 ENCOUNTER — Ambulatory Visit (INDEPENDENT_AMBULATORY_CARE_PROVIDER_SITE_OTHER): Payer: BC Managed Care – PPO | Admitting: Family Medicine

## 2014-08-24 VITALS — BP 108/64 | HR 74 | Temp 98.0°F | Resp 14 | Ht 69.0 in | Wt 156.0 lb

## 2014-08-24 DIAGNOSIS — Z0000 Encounter for general adult medical examination without abnormal findings: Secondary | ICD-10-CM

## 2014-08-24 DIAGNOSIS — M542 Cervicalgia: Secondary | ICD-10-CM

## 2014-08-24 MED ORDER — METHOCARBAMOL 500 MG PO TABS: 500.0000 mg | ORAL_TABLET | Freq: Four times a day (QID) | ORAL | Status: DC

## 2014-08-24 NOTE — Progress Notes (Signed)
Subjective:    Patient ID: Diane Lee, female    DOB: 1981-06-20, 33 y.o.   MRN: [id number]  HPI Patient is a very pleasant 33 year old white female mother of 2 presents today for a complete physical exam. She sees a gynecologist performs her breast exams as well as her Pap smear. Her flu shot is up-to-date. Her tetanus shot was performed 2 years ago. Her Pap smear is up-to-date. She denies any family history of breast cancer colon cancer. She did have some right-sided pleurisy after recent respiratory infection. This is gradually improved. She is no longer having any pleurisy in her right side. Her cough is gone. She denies any fever or shortness of breath chest pain or hemoptysis. She has had 2 weeks of pain in the left side of her neck near the insertion of the trapezius on her occiput. It is worse with lateral rotation of the neck. She denies any numbness or tingling in her left arm. She denies any injury to her neck. Lab on 08/23/2014  Component Date Value Ref Range Status  . Sodium 08/23/2014 135  135 - 145 mEq/L Final  . Potassium 08/23/2014 4.1  3.5 - 5.3 mEq/L Final  . Chloride 08/23/2014 105  96 - 112 mEq/L Final  . CO2 08/23/2014 26  19 - 32 mEq/L Final  . Glucose, Bld 08/23/2014 91  70 - 99 mg/dL Final  . BUN 08/23/2014 11  6 - 23 mg/dL Final  . Creat 08/23/2014 0.71  0.50 - 1.10 mg/dL Final  . Total Bilirubin 08/23/2014 0.6  0.2 - 1.2 mg/dL Final  . Alkaline Phosphatase 08/23/2014 41  39 - 117 U/L Final  . AST 08/23/2014 23  0 - 37 U/L Final  . ALT 08/23/2014 34  0 - 35 U/L Final  . Total Protein 08/23/2014 7.9  6.0 - 8.3 g/dL Final  . Albumin 08/23/2014 4.1  3.5 - 5.2 g/dL Final  . Calcium 08/23/2014 9.3  8.4 - 10.5 mg/dL Final  . GFR, Est African American 08/23/2014 >89   Final  . GFR, Est Non African American 08/23/2014 >89   Final   Comment:   The estimated GFR is a calculation valid for adults (>=37 years old) that uses the CKD-EPI algorithm to adjust for age and sex.  It is   not to be used for children, pregnant women, hospitalized patients,    patients on dialysis, or with rapidly changing kidney function. According to the NKDEP, eGFR >89 is normal, 60-89 shows mild impairment, 30-59 shows moderate impairment, 15-29 shows severe impairment and <15 is ESRD.     Marland Kitchen TSH 08/23/2014 1.843  0.350 - 4.500 uIU/mL Final  . Cholesterol 08/23/2014 128  0 - 200 mg/dL Final   Comment: ATP III Classification:       < 200        mg/dL        Desirable      200 - 239     mg/dL        Borderline High      >= 240        mg/dL        High     . Triglycerides 08/23/2014 64  <150 mg/dL Final  . HDL 08/23/2014 41  >39 mg/dL Final  . Total CHOL/HDL Ratio 08/23/2014 3.1   Final  . VLDL 08/23/2014 13  0 - 40 mg/dL Final  . LDL Cholesterol 08/23/2014 74  0 - 99 mg/dL Final  Comment:   Total Cholesterol/HDL Ratio:CHD Risk                        Coronary Heart Disease Risk Table                                        Men       Women          1/2 Average Risk              3.4        3.3              Average Risk              5.0        4.4           2X Average Risk              9.6        7.1           3X Average Risk             23.4       11.0 Use the calculated Patient Ratio above and the CHD Risk table  to determine the patient's CHD Risk. ATP III Classification (LDL):       < 100        mg/dL         Optimal      100 - 129     mg/dL         Near or Above Optimal      130 - 159     mg/dL         Borderline High      160 - 189     mg/dL         High       > 190        mg/dL         Very High     . WBC 08/23/2014 3.0* 4.0 - 10.5 K/uL Final  . RBC 08/23/2014 4.45  3.87 - 5.11 MIL/uL Final  . Hemoglobin 08/23/2014 12.4  12.0 - 15.0 g/dL Final  . HCT 08/23/2014 37.1  36.0 - 46.0 % Final  . MCV 08/23/2014 83.4  78.0 - 100.0 fL Final  . MCH 08/23/2014 27.9  26.0 - 34.0 pg Final  . MCHC 08/23/2014 33.4  30.0 - 36.0 g/dL Final  . RDW 08/23/2014 12.8  11.5 - 15.5 %  Final  . Platelets 08/23/2014 152  150 - 400 K/uL Final  . MPV 08/23/2014 9.7  9.4 - 12.4 fL Final  . Neutrophils Relative % 08/23/2014 47  43 - 77 % Final  . Neutro Abs 08/23/2014 1.4* 1.7 - 7.7 K/uL Final  . Lymphocytes Relative 08/23/2014 39  12 - 46 % Final  . Lymphs Abs 08/23/2014 1.2  0.7 - 4.0 K/uL Final  . Monocytes Relative 08/23/2014 14* 3 - 12 % Final  . Monocytes Absolute 08/23/2014 0.4  0.1 - 1.0 K/uL Final  . Eosinophils Relative 08/23/2014 0  0 - 5 % Final  . Eosinophils Absolute 08/23/2014 0.0  0.0 - 0.7 K/uL Final  . Basophils Relative 08/23/2014 0  0 - 1 % Final  . Basophils Absolute 08/23/2014 0.0  0.0 - 0.1 K/uL Final  . Smear Review  08/23/2014 Criteria for review not met   Final   Past Medical History  Diagnosis Date  . Abnormal Pap smear   . 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 Cervical biopsy      Benign  . Tonsillectomy    . Wisdom tooth extraction    . Vaginal delivery     No current outpatient prescriptions on file prior to visit.   No current facility-administered medications on file prior to visit.   Allergies  Allergen Reactions  . Bactrim Other (See Comments)    Pt states that this med causes joint pain, and her white blood cells and platelets to drop.   History   Social History  . Marital Status: Married    Spouse Name: N/A    Number of Children: N/A  . Years of Education: N/A   Occupational History  . Not on file.   Social History Main Topics  . Smoking status: Never Smoker   . Smokeless tobacco: Never Used  . Alcohol Use: No  . Drug Use: No  . Sexual Activity: Yes   Other Topics Concern  . Not on file   Social History Narrative   No family history on file.    Review of Systems  All other systems reviewed and are negative.      Objective:   Physical Exam  Constitutional: She is oriented to person, place, and time. She appears  well-developed and well-nourished. No distress.  HENT:  Head: Normocephalic and atraumatic.  Right Ear: External ear normal.  Left Ear: External ear normal.  Nose: Nose normal.  Mouth/Throat: Oropharynx is clear and moist. No oropharyngeal exudate.  Eyes: Conjunctivae and EOM are normal. Pupils are equal, round, and reactive to light. Right eye exhibits no discharge. Left eye exhibits no discharge. No scleral icterus.  Neck: Normal range of motion. Neck supple. No JVD present. No tracheal deviation present. No thyromegaly present.  Cardiovascular: Normal rate, regular rhythm, normal heart sounds and intact distal pulses.  Exam reveals no gallop and no friction rub.   No murmur heard. Pulmonary/Chest: Effort normal and breath sounds normal. No stridor. No respiratory distress. She has no wheezes. She has no rales. She exhibits no tenderness.  Abdominal: Soft. Bowel sounds are normal. She exhibits no distension and no mass. There is no tenderness. There is no rebound and no guarding.  Musculoskeletal: Normal range of motion. She exhibits no edema or tenderness.  Lymphadenopathy:    She has no cervical adenopathy.  Neurological: She is alert and oriented to person, place, and time. She has normal reflexes. She displays normal reflexes. No cranial nerve deficit. She exhibits normal muscle tone. Coordination normal.  Skin: Skin is warm. No rash noted. She is not diaphoretic. No erythema. No pallor.  Psychiatric: She has a normal mood and affect. Her behavior is normal. Judgment and thought content normal.  Vitals reviewed.         Assessment & Plan:  Neck pain - Plan: methocarbamol (ROBAXIN) 500 MG tablet  Routine general medical examination at a health care facility  I believe the patient's neck pain is likely a cervical strain. I recommended heat and Robaxin 500 mg by mouth daily at bedtime. If pain persists more than 1 week I will proceed with an x-ray of her neck. The remainder of her  physical exam and her lab work is excellent. Her immunizations are up-to-date.  Her cancer screening is up-to-date. Regular anticipatory guidance is provided

## 2014-11-18 ENCOUNTER — Ambulatory Visit (INDEPENDENT_AMBULATORY_CARE_PROVIDER_SITE_OTHER): Payer: BLUE CROSS/BLUE SHIELD | Admitting: Physician Assistant

## 2014-11-18 ENCOUNTER — Encounter: Payer: Self-pay | Admitting: Physician Assistant

## 2014-11-18 VITALS — BP 114/76 | HR 88 | Temp 97.9°F | Resp 18 | Wt 155.0 lb

## 2014-11-18 DIAGNOSIS — B9689 Other specified bacterial agents as the cause of diseases classified elsewhere: Principal | ICD-10-CM

## 2014-11-18 DIAGNOSIS — J988 Other specified respiratory disorders: Secondary | ICD-10-CM

## 2014-11-18 MED ORDER — AZITHROMYCIN 250 MG PO TABS: ORAL_TABLET | ORAL | Status: DC

## 2014-11-18 NOTE — Progress Notes (Signed)
    Patient ID: Hal Hopendrea L Boldon MRN: [id number], DOB: February 02, 1981, 34 y.o. Date of Encounter: 11/18/2014, 4:09 PM    Chief Complaint:  Chief Complaint  Patient presents with  . sick x 8 days    cough/hoarseness     HPI: 34 y.o. year old female states that she has had cough for 10 days and his had this hoarseness for 5 days. She is not completely hoarse but her voice is scratchy. Says at times she is able to get phlegm out. Has had no head or nasal congestion and no mucus from the nose. No significant sore throat no fevers or chills.     Home Meds:   Outpatient Prescriptions Prior to Visit  Medication Sig Dispense Refill  . methocarbamol (ROBAXIN) 500 MG tablet Take 1 tablet (500 mg total) by mouth 4 (four) times daily. 30 tablet 0   No facility-administered medications prior to visit.    Allergies:  Allergies  Allergen Reactions  . Bactrim Other (See Comments)    Pt states that this med causes joint pain, and her white blood cells and platelets to drop.      Review of Systems: See HPI for pertinent ROS. All other ROS negative.    Physical Exam: Blood pressure 114/76, pulse 88, temperature 97.9 F (36.6 C), temperature source Oral, resp. rate 18, weight 155 lb (70.308 kg)., Body mass index is 22.88 kg/(m^2). General:  WNWD WM. Appears in no acute distress. HEENT: Normocephalic, atraumatic, eyes without discharge, sclera non-icteric, nares are without discharge. Bilateral auditory canals clear, TM's are without perforation, pearly grey and translucent with reflective cone of light bilaterally. Oral cavity moist, posterior pharynx without exudate, erythema, peritonsillar abscess.  Neck: Supple. No thyromegaly. No lymphadenopathy. Lungs: Clear bilaterally to auscultation without wheezes, rales, or rhonchi. Breathing is unlabored. Heart: Regular rhythm. No murmurs, rubs, or gallops. Msk:  Strength and tone normal for age. Extremities/Skin: Warm and dry. Neuro: Alert and oriented  X 3. Moves all extremities spontaneously. Gait is normal. CNII-XII grossly in tact. Psych:  Responds to questions appropriately with a normal affect.     ASSESSMENT AND PLAN:  34 y.o. year old female with  1. Bacterial respiratory infection - azithromycin (ZITHROMAX) 250 MG tablet; Day 1: Take 2 daily.  Days 2-5: Take 1 daily.  Dispense: 6 tablet; Refill: 0 Take antibiotic as directed. Also recommend she use Mucinex DM as expectorant. Follow-up if symptoms do not resolve within 1 week of completion of antibiotic.  Murray HodgkinsSigned, Mary Beth Monroeville[place], [place], [facility] 11/18/2014 4:09 PM

## 2014-11-29 ENCOUNTER — Telehealth: Payer: Self-pay | Admitting: Family Medicine

## 2014-11-29 MED ORDER — LEVOFLOXACIN 750 MG PO TABS: 750.0000 mg | ORAL_TABLET | Freq: Every day | ORAL | Status: DC

## 2014-11-29 NOTE — Telephone Encounter (Signed)
Levaquin 750mg  1 po QD x 7 days.  If symptoms do not completely resolve after this, then definitely needs to make sure to follow-up with us.

## 2014-11-29 NOTE — Telephone Encounter (Signed)
Was in for cough to see us, cough got better but still coughing up a lot of Stuff Please call her back at  [phone]

## 2014-11-29 NOTE — Telephone Encounter (Signed)
Treated 3/24 for URI.  Still with productive cough.  Was using the Mucinex DM as long as on Z-pak.  Please advise.

## 2014-11-29 NOTE — Telephone Encounter (Signed)
Pt aware of change in antibiotic.  RX to pharmacy.  Does NTBS if not better after this treatment.

## 2015-05-18 ENCOUNTER — Ambulatory Visit (HOSPITAL_COMMUNITY)
Admission: RE | Admit: 2015-05-18 | Discharge: 2015-05-18 | Disposition: A | Discharge status: Home / Self Care | Payer: BLUE CROSS/BLUE SHIELD | Source: Ambulatory Visit | Attending: Obstetrics and Gynecology | Admitting: Obstetrics and Gynecology

## 2015-05-18 ENCOUNTER — Other Ambulatory Visit (HOSPITAL_COMMUNITY): Payer: Self-pay | Admitting: Obstetrics and Gynecology

## 2015-05-18 DIAGNOSIS — R062 Wheezing: Secondary | ICD-10-CM

## 2015-05-18 DIAGNOSIS — R0602 Shortness of breath: Secondary | ICD-10-CM | POA: Insufficient documentation

## 2015-06-01 ENCOUNTER — Encounter: Payer: Self-pay | Admitting: Pulmonary Disease

## 2015-06-01 ENCOUNTER — Other Ambulatory Visit (INDEPENDENT_AMBULATORY_CARE_PROVIDER_SITE_OTHER): Payer: BLUE CROSS/BLUE SHIELD

## 2015-06-01 ENCOUNTER — Ambulatory Visit (INDEPENDENT_AMBULATORY_CARE_PROVIDER_SITE_OTHER): Payer: BLUE CROSS/BLUE SHIELD | Admitting: Pulmonary Disease

## 2015-06-01 VITALS — BP 110/62 | HR 83 | Temp 98.1°F | Ht 69.0 in | Wt 159.6 lb

## 2015-06-01 DIAGNOSIS — R062 Wheezing: Secondary | ICD-10-CM

## 2015-06-01 DIAGNOSIS — Z23 Encounter for immunization: Secondary | ICD-10-CM

## 2015-06-01 LAB — CBC WITH DIFFERENTIAL/PLATELET
BASOS ABS: 0 10*3/uL (ref 0.0–0.1)
Basophils Relative: 0.6 % (ref 0.0–3.0)
Eosinophils Absolute: 0 10*3/uL (ref 0.0–0.7)
Eosinophils Relative: 0 % (ref 0.0–5.0)
HEMATOCRIT: 36.3 % (ref 36.0–46.0)
Hemoglobin: 12.3 g/dL (ref 12.0–15.0)
LYMPHS PCT: 39 % (ref 12.0–46.0)
Lymphs Abs: 1.2 10*3/uL (ref 0.7–4.0)
MCHC: 33.9 g/dL (ref 30.0–36.0)
MCV: 84.2 fl (ref 78.0–100.0)
MONOS PCT: 11.3 % (ref 3.0–12.0)
Monocytes Absolute: 0.3 10*3/uL (ref 0.1–1.0)
NEUTROS ABS: 1.5 10*3/uL (ref 1.4–7.7)
Neutrophils Relative %: 49.1 % (ref 43.0–77.0)
Platelets: 153 10*3/uL (ref 150.0–400.0)
RBC: 4.31 Mil/uL (ref 3.87–5.11)
RDW: 12 % (ref 11.5–15.5)
WBC: 3 10*3/uL — ABNORMAL LOW (ref 4.0–10.5)

## 2015-06-01 MED ORDER — ALBUTEROL SULFATE HFA 108 (90 BASE) MCG/ACT IN AERS: 2.0000 | INHALATION_SPRAY | Freq: Four times a day (QID) | RESPIRATORY_TRACT | Status: DC | PRN

## 2015-06-01 NOTE — Progress Notes (Signed)
   Subjective:    Patient ID: Diane Lee, female    DOB: 03/16/1981, 34 y.o.   MRN: [id number]  HPI Consult for evaluation of dyspnea, wheezing.  Diane Lee is a 34 year old with history of asthma in high school. At that time she was prescribed an albuterol inhaler which she used just before any sports activity. The symptoms have since resolved but then returned about 6 years ago after the birth of her first child. She states that she gets short of breath, wheezy at rest and during exertion. She has not noticed any particular exacerbating factors. She is not sensitive to dust, pollen, cats, dogs, mold. She does not have any seasonal allergies.  She got an x-ray in mid September for this. This did not show any acute cardiopulmonary process. She is healthy apart from abnormal Pap smear, low WBC and platelet count. Her low counts had been evaluated by oncology and this was deemed as a benign process.  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 She is a never smoker has occasional alcohol intake. No illegal drug use.   Past Medical History  Diagnosis Date  . Abnormal Pap smear   . Thrombocytopenic, cyclic (HCC)     Was borderline, has been cleared by MD   ' Current outpatient prescriptions:  .  levofloxacin (LEVAQUIN) 750 MG tablet, Take 1 tablet (750 mg total) by mouth daily., Disp: 7 tablet, Rfl: 0 .  Multiple Vitamin (MULTIVITAMIN) tablet, Take 1 tablet by mouth daily., Disp: , Rfl:   CXR [05/18/15] No acute cardiopulmonary process.  Review of Systems  Shortness of breath associated with wheezing. Denies any cough, sputum production, hemoptysis. Denies any nasal discharge, rhinitis, postnasal drip. Denies any GERD, heartburn. Denies any nausea, vomiting, diarrhea, constipation. All other review of systems are negative.  Blood pressure 110/62, pulse 83, temperature 98.1 F (36.7 C), temperature source Oral, height  (1.753 m), weight 159 lb 9.6 oz (72.394 kg), last menstrual period  04/21/2015, SpO2 100 %.     Objective:   Physical Exam General: No distress Neuro: No focal deficits HEENT: PERRLA, Moist mucus membranes Cardiovascular: RRR, S1 S2 Lungs: Clear to auscultation. No wheeze or crackles Abdomen: Soft, BS+ Skin: No rash/bruising.    Assessment & Plan:   Dyspnea with wheezing.  This is likely asthma. She does not have any GERD, sinusitis, allergic symptoms. I will try her on an albuterol inhaler when necessary and get lung function tests with a bronchodilator response. I will also send her for blood tests including CBC with differential, IgE levels. Flu vaccination today.  Return to clinic in 1-2 months to review results and response to inhalers.  Chilton Greathouse MD [facility] Pager [phone] If no answer or after 3pm call: [phone] 06/01/2015, 3:27 PM

## 2015-06-01 NOTE — Patient Instructions (Signed)
Flu vaccination today Start albuterol inhaler. Please use it as needed when you get short of breath. We will schedule you for lung function tests

## 2015-06-02 LAB — IGE: IGE (IMMUNOGLOBULIN E), SERUM: 7 kU/L (ref <115)

## 2015-08-12 ENCOUNTER — Encounter: Payer: Self-pay | Admitting: Pulmonary Disease

## 2015-08-12 ENCOUNTER — Ambulatory Visit (INDEPENDENT_AMBULATORY_CARE_PROVIDER_SITE_OTHER): Payer: BLUE CROSS/BLUE SHIELD | Admitting: Pulmonary Disease

## 2015-08-12 VITALS — BP 128/72 | HR 89 | Ht 69.0 in | Wt 163.0 lb

## 2015-08-12 DIAGNOSIS — R062 Wheezing: Secondary | ICD-10-CM

## 2015-08-12 DIAGNOSIS — J4520 Mild intermittent asthma, uncomplicated: Secondary | ICD-10-CM

## 2015-08-12 LAB — PULMONARY FUNCTION TEST
DL/VA % PRED: 86 %
DL/VA: 4.64 ml/min/mmHg/L
DLCO unc % pred: 83 %
DLCO unc: 25.87 ml/min/mmHg
FEF 25-75 POST: 4.86 L/s
FEF 25-75 Pre: 4.08 L/sec
FEF2575-%CHANGE-POST: 19 %
FEF2575-%PRED-POST: 134 %
FEF2575-%PRED-PRE: 113 %
FEV1-%CHANGE-POST: 4 %
FEV1-%PRED-PRE: 106 %
FEV1-%Pred-Post: 111 %
FEV1-PRE: 3.82 L
FEV1-Post: 4.01 L
FEV1FVC-%CHANGE-POST: 4 %
FEV1FVC-%PRED-PRE: 100 %
FEV6-%Change-Post: 0 %
FEV6-%Pred-Post: 107 %
FEV6-%Pred-Pre: 106 %
FEV6-POST: 4.64 L
FEV6-Pre: 4.6 L
FEV6FVC-%Change-Post: 0 %
FEV6FVC-%PRED-POST: 102 %
FEV6FVC-%Pred-Pre: 102 %
FVC-%CHANGE-POST: 0 %
FVC-%PRED-POST: 105 %
FVC-%PRED-PRE: 105 %
FVC-POST: 4.64 L
FVC-PRE: 4.6 L
POST FEV1/FVC RATIO: 86 %
PRE FEV1/FVC RATIO: 83 %
PRE FEV6/FVC RATIO: 100 %
Post FEV6/FVC ratio: 100 %
RV % pred: 90 %
RV: 1.56 L
TLC % pred: 103 %
TLC: 6.01 L

## 2015-08-12 NOTE — Patient Instructions (Signed)
Continue using the albuterol inhaler when necessary  Return to clinic in 6 months.

## 2015-08-12 NOTE — Progress Notes (Signed)
Subjective:    Patient ID: Diane Lee, female    DOB: Oct 18, 1980, 34 y.o.   MRN: [id number]  PROBLEM LIST: Mild intermittent asthma  HPI  Diane Lee is a 34 year old with history of asthma in high school. At that time she was prescribed an albuterol inhaler which she used just before any sports activity. The symptoms have since resolved but then returned about 6 years ago after the birth of her first child. She states that she gets short of breath, wheezy at rest and during exertion. She has not noticed any particular exacerbating factors. She is not sensitive to dust, pollen, cats, dogs, mold. She does not have any seasonal allergies.  She got an x-ray in mid September for this. This did not show any acute cardiopulmonary process. She is healthy apart from abnormal Pap smear, low WBC and platelet count. Her low counts had been evaluated by oncology and this was deemed as a benign process.  She is a Chartered loss adjusterschoolteacher for fifth grade. She is a never smoker has occasional alcohol intake. No illegal drug use.  Interim history: She was started on albuterol rescue inhaler at the last visit. She says that it helps with her breathing but makes her heart race so been taking only 1 puff. Her symptoms of dyspnea and wheezing have improved since last visit. She gets such episodes only once a week. She had PFTs today which are entirely normal. Her CBC does not show any eosinophilia and IgE levels are normal.  Data: PFTs 08/12/15 FVC 4.60 [105%) FEV1 3.82 [106%] F/F 83 SVC 4.38 [101% TLC 5.8 [103%] DLCO 83%. Normal lung function tests. No bronchodilator response.  06/01/15 CBC- WBC 3, eosinophils 0% IgE- 7  CXR 05/18/15 No acute cardiopulmonary process.  Past Medical History  Diagnosis Date  . Abnormal Pap smear   . Thrombocytopenic, cyclic (HCC)     Was borderline, has been cleared by MD   ' Current outpatient prescriptions:  .  albuterol (PROVENTIL HFA;VENTOLIN HFA) 108 (90 BASE) MCG/ACT  inhaler, Inhale 2 puffs into the lungs every 6 (six) hours as needed for wheezing or shortness of breath., Disp: 1 Inhaler, Rfl: 6 .  amoxicillin-clavulanate (AUGMENTIN) 875-125 MG tablet, Take 1 tablet by mouth 2 (two) times daily., Disp: , Rfl: 0 .  benzonatate (TESSALON) 100 MG capsule, Take 1 capsule by mouth 3 (three) times daily., Disp: , Rfl: 0 .  levofloxacin (LEVAQUIN) 750 MG tablet, Take 1 tablet (750 mg total) by mouth daily., Disp: 7 tablet, Rfl: 0 .  LO LOESTRIN FE 1 MG-10 MCG / 10 MCG tablet, Take 1 tablet by mouth daily., Disp: , Rfl: 4 .  Multiple Vitamin (MULTIVITAMIN) tablet, Take 1 tablet by mouth daily., Disp: , Rfl:    Review of Systems  Shortness of breath associated with wheezing. Denies any cough, sputum production, hemoptysis. Denies any nasal discharge, rhinitis, postnasal drip. Denies any GERD, heartburn. Denies any nausea, vomiting, diarrhea, constipation. All other review of systems are negative.     Objective:   Physical Exam Blood pressure 128/72, pulse 89, height 5\' 9"  (1.753 m), weight 163 lb (73.936 kg), SpO2 100 %.  General: No distress Neuro: No focal deficits HEENT: PERRLA, Moist mucus membranes Cardiovascular: RRR, S1 S2 Lungs: Clear to auscultation. No wheeze or crackles Abdomen: Soft, BS+ Skin: No rash/bruising.    Assessment & Plan:  Mild intermittent asthma. Well controlled on albuterol rescue inhaler. Her PFTs are entirely normal and her blood work does not show any  eosinophilia or elevated IgE levels. She does not have any GERD, sinusitis, allergic symptoms.   She will continue her albuterol inhaler. I don't believe she needs any additional controller medication such as inhaled corticosteroid at this point. We can reconsider if her symptoms worsen.  Plan: - Continue albuterol rescue inhaler.  Return to clinic in 6 months.  Chilton Greathouse MD [facility] Pager [phone] If no answer or after 3pm call:  [phone] 08/12/2015, 2:23 PM

## 2015-08-12 NOTE — Progress Notes (Signed)
PFT done today. 

## 2015-09-14 ENCOUNTER — Other Ambulatory Visit: Payer: Self-pay | Admitting: Obstetrics and Gynecology

## 2015-09-14 DIAGNOSIS — R928 Other abnormal and inconclusive findings on diagnostic imaging of breast: Secondary | ICD-10-CM

## 2015-09-21 ENCOUNTER — Ambulatory Visit
Admission: RE | Admit: 2015-09-21 | Discharge: 2015-09-21 | Disposition: A | Discharge status: Home / Self Care | Payer: BLUE CROSS/BLUE SHIELD | Source: Ambulatory Visit | Attending: Obstetrics and Gynecology | Admitting: Obstetrics and Gynecology

## 2015-09-21 DIAGNOSIS — R928 Other abnormal and inconclusive findings on diagnostic imaging of breast: Secondary | ICD-10-CM

## 2015-10-26 ENCOUNTER — Telehealth: Payer: Self-pay | Admitting: Family Medicine

## 2015-10-26 NOTE — Telephone Encounter (Signed)
Patient is calling to say she is taking heart medication and thinks it could be making her heart flutter would like a call back regarding this at  [phone]

## 2015-10-26 NOTE — Telephone Encounter (Signed)
Spoke to pt and she states that she has been having episode of heart fluttering with some dizziness but it has only happened twice and went away fairly quickly. She states that she has just recently started Doxycycline and has been on it for a week and was wondering if that was causing it.? I highly recommended an appt. She agreed and appt made. Also informed pt that is it happens again and she get SOB or develops chest pain she is to go to ER immediatly. She verbalized understanding.

## 2015-10-27 ENCOUNTER — Ambulatory Visit (INDEPENDENT_AMBULATORY_CARE_PROVIDER_SITE_OTHER): Payer: BLUE CROSS/BLUE SHIELD | Admitting: Physician Assistant

## 2015-10-27 ENCOUNTER — Encounter: Payer: Self-pay | Admitting: Physician Assistant

## 2015-10-27 VITALS — BP 114/76 | HR 96 | Temp 98.0°F | Resp 18 | Wt 164.0 lb

## 2015-10-27 DIAGNOSIS — R002 Palpitations: Secondary | ICD-10-CM | POA: Diagnosis not present

## 2015-10-27 NOTE — Progress Notes (Signed)
Patient ID: ODESSER TOURANGEAU MRN: [id number], DOB: 07-21-1981, 35 y.o. Date of Encounter: @  Chief Complaint:  Chief Complaint  Patient presents with  . poss med reaction    recently seen by eye doctor put on oral doxycycline and eye drops  now this wee has been having occ episodes of heart palpitations, called eye doctor told to stop doxy, also has had to use rescue inhaler more recently    HPI: 35 y.o. year old white female  presents with above.   She reports that she took the doxycycline for 2 weeks. States that she may have felt an occasional flutter prior to Monday, but the main time she remembers really noticing any flutter/palpitation occurring was Monday 10/24/15. Says at that time she was walking down the hall at school with a coworker and felt her heart flutter.  Says that that evening she felt a little flutter. Says that she really has not felt any flutter or palpitations since then. States that since Monday she has used her albuterol once a day. States that she isn't sure whether she really is having wheezing or if she is just anxious and afraid that the palpitation is going to recur. Also states that since Monday she has decreased caffeine intake because she has been scared to drink a lot of caffeine since this has happened. Says in general she doesn't drink a lot of caffeine but usually would have a tea in the morning and then a small can of Dr. Reino Kent at lunch. Since Monday she has stopped the tea and Dr. Reino Kent.  No other complaints or concerns. She has had no chest pressure, heaviness, tightness-- even with exertion. Other than what is documented above regarding her albuterol, she has had no other shortness of breath and no dyspnea on exertion. No syncope or presyncope.   Past Medical History  Diagnosis Date  . Abnormal Pap smear   . Thrombocytopenic, cyclic (HCC)     Was borderline, has been cleared by MD     Home Meds: Outpatient Prescriptions Prior to  Visit  Medication Sig Dispense Refill  . albuterol (PROVENTIL HFA;VENTOLIN HFA) 108 (90 BASE) MCG/ACT inhaler Inhale 2 puffs into the lungs every 6 (six) hours as needed for wheezing or shortness of breath. 1 Inhaler 6  . LO LOESTRIN FE 1 MG-10 MCG / 10 MCG tablet Take 1 tablet by mouth daily.  4  . Multiple Vitamin (MULTIVITAMIN) tablet Take 1 tablet by mouth daily.    Marland Kitchen amoxicillin-clavulanate (AUGMENTIN) 875-125 MG tablet Take 1 tablet by mouth 2 (two) times daily.  0  . benzonatate (TESSALON) 100 MG capsule Take 1 capsule by mouth 3 (three) times daily.  0  . levofloxacin (LEVAQUIN) 750 MG tablet Take 1 tablet (750 mg total) by mouth daily. 7 tablet 0   No facility-administered medications prior to visit.    Allergies:  Allergies  Allergen Reactions  . Sulfa Antibiotics     Other reaction(s): Other Joint pain   . Bactrim Other (See Comments)    Pt states that this med causes joint pain, and her white blood cells and platelets to drop.    Social History   Social History  . Marital Status: Married    Spouse Name: N/A  . Number of Children: N/A  . Years of Education: N/A   Occupational History  . teacher    Social History Main Topics  . Smoking status: Never Smoker   . Smokeless tobacco: Never Used  .  Alcohol Use: No  . Drug Use: No  . Sexual Activity: Yes   Other Topics Concern  . Not on file   Social History Narrative    Family History  Problem Relation Age of Onset  . Breast cancer Maternal Grandmother   . Prostate cancer Father      Review of Systems:  See HPI for pertinent ROS. All other ROS negative.    Physical Exam: Blood pressure 114/76, pulse 96, temperature 98 F (36.7 C), temperature source Oral, resp. rate 18, weight 164 lb (74.39 kg)., Body mass index is 24.21 kg/(m^2). General: WNWD WF. Appears in no acute distress. Neck: Supple. No thyromegaly. No lymphadenopathy. Lungs: Clear bilaterally to auscultation without wheezes, rales, or rhonchi.  Breathing is unlabored. Heart: RRR with S1 S2. No murmurs, rubs, or gallops. Musculoskeletal:  Strength and tone normal for age. Extremities/Skin: Warm and dry.No LE edema. Neuro: Alert and oriented X 3. Moves all extremities spontaneously. Gait is normal. CNII-XII grossly in tact. Psych:  Responds to questions appropriately with a normal affect.     ASSESSMENT AND PLAN:  35 y.o. year old female with  1. Palpitations Reassured patient that what she is describing sounds like PVC which is benign. Will obtain EKG.---EKG shows NSR with nonspecific ST/T changes. No arrhythmia. No ectopy.  Will obtain labs. If palpitations persist or increase, she is to follow-up and at that time would obtain monitor. - CBC with Differential/Platelet - COMPLETE METABOLIC PANEL WITH GFR - TSH - Magnesium   Signed, [address] [place], [place], [facility] 10/27/2015 4:24 PM

## 2015-10-28 ENCOUNTER — Encounter: Payer: Self-pay | Admitting: Family Medicine

## 2015-10-28 LAB — CBC WITH DIFFERENTIAL/PLATELET
BASOS ABS: 0 10*3/uL (ref 0.0–0.1)
BASOS PCT: 0 % (ref 0–1)
EOS PCT: 0 % (ref 0–5)
Eosinophils Absolute: 0 10*3/uL (ref 0.0–0.7)
HCT: 35.8 % — ABNORMAL LOW (ref 36.0–46.0)
HEMOGLOBIN: 11.7 g/dL — AB (ref 12.0–15.0)
LYMPHS PCT: 27 % (ref 12–46)
Lymphs Abs: 1 10*3/uL (ref 0.7–4.0)
MCH: 27.9 pg (ref 26.0–34.0)
MCHC: 32.7 g/dL (ref 30.0–36.0)
MCV: 85.2 fL (ref 78.0–100.0)
MONO ABS: 0.4 10*3/uL (ref 0.1–1.0)
MPV: 10.5 fL (ref 8.6–12.4)
Monocytes Relative: 11 % (ref 3–12)
NEUTROS ABS: 2.2 10*3/uL (ref 1.7–7.7)
Neutrophils Relative %: 62 % (ref 43–77)
Platelets: 153 10*3/uL (ref 150–400)
RBC: 4.2 MIL/uL (ref 3.87–5.11)
RDW: 13.3 % (ref 11.5–15.5)
WBC: 3.6 10*3/uL — AB (ref 4.0–10.5)

## 2015-10-28 LAB — COMPLETE METABOLIC PANEL WITH GFR
ALT: 19 U/L (ref 6–29)
AST: 18 U/L (ref 10–30)
Albumin: 3.8 g/dL (ref 3.6–5.1)
Alkaline Phosphatase: 29 U/L — ABNORMAL LOW (ref 33–115)
BUN: 11 mg/dL (ref 7–25)
CHLORIDE: 102 mmol/L (ref 98–110)
CO2: 26 mmol/L (ref 20–31)
Calcium: 8.8 mg/dL (ref 8.6–10.2)
Creat: 0.7 mg/dL (ref 0.50–1.10)
GFR, Est African American: >89 mL/min (ref >=60)
GFR, Est Non African American: >89 mL/min (ref >=60)
GLUCOSE: 84 mg/dL (ref 70–99)
POTASSIUM: 3.8 mmol/L (ref 3.5–5.3)
SODIUM: 135 mmol/L (ref 135–146)
Total Bilirubin: 0.4 mg/dL (ref 0.2–1.2)
Total Protein: 7.8 g/dL (ref 6.1–8.1)

## 2015-10-28 LAB — TSH: TSH: 0.92 mIU/L

## 2015-10-28 LAB — MAGNESIUM: Magnesium: 1.8 mg/dL (ref 1.5–2.5)

## 2016-02-09 ENCOUNTER — Ambulatory Visit: Payer: BLUE CROSS/BLUE SHIELD | Admitting: Pulmonary Disease

## 2016-05-29 ENCOUNTER — Other Ambulatory Visit: Payer: Self-pay | Admitting: Obstetrics and Gynecology

## 2016-05-29 DIAGNOSIS — N63 Unspecified lump in unspecified breast: Secondary | ICD-10-CM

## 2016-06-06 ENCOUNTER — Ambulatory Visit
Admission: RE | Admit: 2016-06-06 | Discharge: 2016-06-06 | Disposition: A | Discharge status: Home / Self Care | Payer: BLUE CROSS/BLUE SHIELD | Source: Ambulatory Visit | Attending: Obstetrics and Gynecology | Admitting: Obstetrics and Gynecology

## 2016-06-06 DIAGNOSIS — N63 Unspecified lump in unspecified breast: Secondary | ICD-10-CM

## 2016-07-04 ENCOUNTER — Encounter: Payer: Self-pay | Admitting: Physician Assistant

## 2016-07-04 ENCOUNTER — Ambulatory Visit (INDEPENDENT_AMBULATORY_CARE_PROVIDER_SITE_OTHER): Payer: BLUE CROSS/BLUE SHIELD | Admitting: Physician Assistant

## 2016-07-04 VITALS — BP 114/72 | HR 101 | Temp 97.5°F | Resp 18 | Ht 69.0 in | Wt 163.0 lb

## 2016-07-04 DIAGNOSIS — B029 Zoster without complications: Secondary | ICD-10-CM | POA: Diagnosis not present

## 2016-07-04 MED ORDER — VALACYCLOVIR HCL 1 G PO TABS: 1000.0000 mg | ORAL_TABLET | Freq: Three times a day (TID) | ORAL | 0 refills | Status: DC

## 2016-07-04 MED ORDER — GABAPENTIN 300 MG PO CAPS: ORAL_CAPSULE | ORAL | 0 refills | Status: DC

## 2016-07-04 NOTE — Progress Notes (Signed)
Patient ID: Diane Lee MRN: [id number], DOB: Apr 23, 1981, 35 y.o. Date of Encounter: 07/04/2016, 4:29 PM    Chief Complaint:  Chief Complaint  Patient presents with  . Rash    on back and under right breast     HPI: 35 y.o. year old female presents with above.   Says that she just noticed this rash yesterday slightly and then more so today. Says area of rash on her left back and on her left chest--on the side of her left breast. Says that it is kind of painful. No other complaints or concerns.     Home Meds:   Outpatient Medications Prior to Visit  Medication Sig Dispense Refill  . albuterol (PROVENTIL HFA;VENTOLIN HFA) 108 (90 BASE) MCG/ACT inhaler Inhale 2 puffs into the lungs every 6 (six) hours as needed for wheezing or shortness of breath. 1 Inhaler 6  . LO LOESTRIN FE 1 MG-10 MCG / 10 MCG tablet Take 1 tablet by mouth daily.  4  . Multiple Vitamin (MULTIVITAMIN) tablet Take 1 tablet by mouth daily.    . RESTASIS 0.05 % ophthalmic emulsion INSTILL 1 DROP IN BOTH EYES TWICE A DAY  4  . doxycycline (VIBRAMYCIN) 100 MG capsule Reported on 10/27/2015  1  . LOTEMAX 0.5 % ophthalmic suspension INSTILL1 DROP IN BOTH EYES FOUR TIMES A DAY  1  . PREVIDENT 5000 BOOSTER PLUS 1.1 % PSTE Uses occasionally  0   No facility-administered medications prior to visit.     Allergies:  Allergies  Allergen Reactions  . Sulfa Antibiotics     Other reaction(s): Other Joint pain   . Bactrim Other (See Comments)    Pt states that this med causes joint pain, and her white blood cells and platelets to drop.      Review of Systems: See HPI for pertinent ROS. All other ROS negative.    Physical Exam: Blood pressure 114/72, pulse (!) 101, temperature 97.5 F (36.4 C), temperature source Oral, resp. rate 18, height 5\' 9"  (1.753 m), weight 163 lb (73.9 kg), last menstrual period 06/20/2016, SpO2 99 %., Body mass index is 24.07 kg/m. General: WNWD WF. Appears in no acute distress. Neck:  Supple. No thyromegaly. No lymphadenopathy. Lungs: Clear bilaterally to auscultation without wheezes, rales, or rhonchi. Breathing is unlabored. Heart: Regular rhythm. No murmurs, rubs, or gallops. Msk:  Strength and tone normal for age. Skin: Back just slightly left of midline at about T6 level has an area of rash that measures approximate 1.5" x 1.5". This area is filled with multiple erythematous papules and vesicles.  It is one other patch of rash on the left side of her breast at T6 level. This area is approximately 1 inch diameter and also has grouped vesicles/papules which are erythematous. Neuro: Alert and oriented X 3. Moves all extremities spontaneously. Gait is normal. CNII-XII grossly in tact. Psych:  Responds to questions appropriately with a normal affect.     ASSESSMENT AND PLAN:  35 y.o. year old female with  1. Herpes zoster without complication I discussed this diagnosis with her and answered all questions. She is to take the Valtrex as directed and complete all of it. She is to take the Neurontin as directed and titrate dose up as directed below. Once she gets up to that dose of taking one 3 times a day, if this is not controlling her pain and she is to call me for further instructions on how to further titrate up this medication.  She is to then take this for about 2 weeks and then she can try going without the medication and determine when she can wean off of this based on her symptoms. He voices understanding and agrees. - valACYclovir (VALTREX) 1000 MG tablet; Take 1 tablet (1,000 mg total) by mouth 3 (three) times daily.  Dispense: 21 tablet; Refill: 0 - gabapentin (NEURONTIN) 300 MG capsule; Take 1 daily on day 1. Increase to 1 twice a day on day 2. Then increase to one 3 times a day.  Dispense: 90 capsule; Refill: 0   Signed, [address]Mary Beth LincolnDixon, [place], [facility] 07/04/2016 4:29 PM

## 2016-07-31 ENCOUNTER — Other Ambulatory Visit: Payer: Self-pay | Admitting: Physician Assistant

## 2016-07-31 DIAGNOSIS — B029 Zoster without complications: Secondary | ICD-10-CM

## 2016-07-31 NOTE — Telephone Encounter (Signed)
Rx filled per protocol  

## 2017-05-28 ENCOUNTER — Ambulatory Visit (INDEPENDENT_AMBULATORY_CARE_PROVIDER_SITE_OTHER): Payer: BLUE CROSS/BLUE SHIELD | Admitting: Family Medicine

## 2017-05-28 ENCOUNTER — Ambulatory Visit (HOSPITAL_COMMUNITY)
Admission: RE | Admit: 2017-05-28 | Discharge: 2017-05-28 | Disposition: A | Discharge status: Home / Self Care | Payer: BLUE CROSS/BLUE SHIELD | Source: Ambulatory Visit | Attending: Family Medicine | Admitting: Family Medicine

## 2017-05-28 ENCOUNTER — Encounter: Payer: Self-pay | Admitting: Family Medicine

## 2017-05-28 VITALS — BP 120/74 | HR 104 | Temp 97.9°F | Resp 16 | Wt 164.0 lb

## 2017-05-28 DIAGNOSIS — R918 Other nonspecific abnormal finding of lung field: Secondary | ICD-10-CM | POA: Diagnosis not present

## 2017-05-28 DIAGNOSIS — R091 Pleurisy: Secondary | ICD-10-CM

## 2017-05-28 LAB — D-DIMER, QUANTITATIVE: D-Dimer, Quant: 0.42 mcg/mL FEU (ref <0.50)

## 2017-05-28 MED ORDER — DICLOFENAC SODIUM 75 MG PO TBEC: 75.0000 mg | DELAYED_RELEASE_TABLET | Freq: Two times a day (BID) | ORAL | 0 refills | Status: DC

## 2017-05-28 MED ORDER — CYCLOBENZAPRINE HCL 10 MG PO TABS: 10.0000 mg | ORAL_TABLET | Freq: Three times a day (TID) | ORAL | 0 refills | Status: DC | PRN

## 2017-05-28 NOTE — Progress Notes (Signed)
Subjective:    Patient ID: Diane Lee, female    DOB: 05/31/1981, 36 y.o.   MRN: [id number]  HPI Patient presents with one day of pleuritic pain in her left ribs below her left breast. She also reports some mild shortness of breath she is concerned about a pulmonary embolism as she recently injured her left leg and she is on birth control. There is a family history of a possible PE in her grandfather. She denies any recent surgeries or plane flights. There is no pitting edema in her left leg or in her right leg. She has a negative Homans sign. The pain is made worse by twisting motion and palpation on the ribs. She also believes that she may have strained her rib muscles yesterday because she was walking awkwardly on her injured left leg she denies any cough. She denies any hemoptysis. The pain is mild. Pulmonary exam today is completely normal aside from some mild expiratory wheezing. She has a mild tachycardia but she is also anxious.  Past Medical History:  Diagnosis Date  . Abnormal Pap smear   . Thrombocytopenic, cyclic (HCC)    Was borderline, has been cleared by MD   Past Surgical History:  Procedure Laterality Date  . CERVICAL BIOPSY     Benign  . TONSILLECTOMY    . VAGINAL DELIVERY    . WISDOM TOOTH EXTRACTION     Current Outpatient Prescriptions on File Prior to Visit  Medication Sig Dispense Refill  . albuterol (PROVENTIL HFA;VENTOLIN HFA) 108 (90 BASE) MCG/ACT inhaler Inhale 2 puffs into the lungs every 6 (six) hours as needed for wheezing or shortness of breath. 1 Inhaler 6  . LO LOESTRIN FE 1 MG-10 MCG / 10 MCG tablet Take 1 tablet by mouth daily.  4  . Multiple Vitamin (MULTIVITAMIN) tablet Take 1 tablet by mouth daily.     No current facility-administered medications on file prior to visit.    Allergies  Allergen Reactions  . Sulfa Antibiotics     Other reaction(s): Other Joint pain   . Bactrim Other (See Comments)    Pt states that this med causes joint pain,  and her white blood cells and platelets to drop.   Social History   Social History  . Marital status: Married    Spouse name: N/A  . Number of children: N/A  . Years of education: N/A   Occupational History  . teacher    Social History Main Topics  . Smoking status: Never Smoker  . Smokeless tobacco: Never Used  . Alcohol use No  . Drug use: No  . Sexual activity: Yes   Other Topics Concern  . Not on file   Social History Narrative  . No narrative on file      Review of Systems  All other systems reviewed and are negative.      Objective:   Physical Exam  Constitutional: She appears well-developed and well-nourished. No distress.  Neck: Neck supple. No JVD present.  Cardiovascular: Normal rate, regular rhythm and normal heart sounds.   No murmur heard. Pulmonary/Chest: Effort normal. No respiratory distress. She has wheezes. She has no rales. She exhibits tenderness.    Abdominal: Soft. Bowel sounds are normal. She exhibits no distension and no mass. There is no tenderness. There is no rebound and no guarding.  Musculoskeletal: She exhibits no edema.  Lymphadenopathy:    She has no cervical adenopathy.  Skin: She is not diaphoretic.  Vitals reviewed.  Assessment & Plan:  Pleurisy - Plan: D-dimer, quantitative (not at [facility]), CBC with Differential/Platelet, COMPLETE METABOLIC PANEL WITH GFR, DG Chest 2 View  I truly believe that this is musculoskeletal. If so, I will treat the patient with diclofenac 75 mg by mouth twice a day as well as Flexeril 5-10 mg every 8 hours as needed for muscle pain. However I will send the patient for a chest x-ray to rule out infection. I will also check a d-dimer. If she does have an elevated d-dimer which I'm sending off statin, I will start the patient on xarelto 15 mg pobid while awaiting CT results.  If d-dimer is negative, I do not feel that she would benefit from a CT and I will treat the patient as though this is  musculoskeletal pain

## 2017-05-29 ENCOUNTER — Other Ambulatory Visit: Payer: Self-pay

## 2017-05-29 LAB — CBC WITH DIFFERENTIAL/PLATELET
Basophils Absolute: 22 cells/uL (ref 0–200)
Basophils Relative: 0.4 %
EOS ABS: 0 {cells}/uL — AB (ref 15–500)
Eosinophils Relative: 0 %
HCT: 32.6 % — ABNORMAL LOW (ref 35.0–45.0)
Hemoglobin: 11.2 g/dL — ABNORMAL LOW (ref 11.7–15.5)
Lymphs Abs: 1155 cells/uL (ref 850–3900)
MCH: 29 pg (ref 27.0–33.0)
MCHC: 34.4 g/dL (ref 32.0–36.0)
MCV: 84.5 fL (ref 80.0–100.0)
MPV: 10.3 fL (ref 7.5–12.5)
Monocytes Relative: 8.6 %
NEUTROS PCT: 70 %
Neutro Abs: 3850 cells/uL (ref 1500–7800)
PLATELETS: 142 10*3/uL (ref 140–400)
RBC: 3.86 10*6/uL (ref 3.80–5.10)
RDW: 12.1 % (ref 11.0–15.0)
TOTAL LYMPHOCYTE: 21 %
WBC: 5.5 10*3/uL (ref 3.8–10.8)
WBCMIX: 473 {cells}/uL (ref 200–950)

## 2017-05-29 LAB — COMPLETE METABOLIC PANEL WITH GFR
AG Ratio: 0.9 (calc) — ABNORMAL LOW (ref 1.0–2.5)
ALT: 14 U/L (ref 6–29)
AST: 14 U/L (ref 10–30)
Albumin: 3.7 g/dL (ref 3.6–5.1)
Alkaline phosphatase (APISO): 36 U/L (ref 33–115)
BILIRUBIN TOTAL: 0.5 mg/dL (ref 0.2–1.2)
BUN: 11 mg/dL (ref 7–25)
CALCIUM: 8.8 mg/dL (ref 8.6–10.2)
CHLORIDE: 104 mmol/L (ref 98–110)
CO2: 26 mmol/L (ref 20–32)
Creat: 0.8 mg/dL (ref 0.50–1.10)
GFR, EST AFRICAN AMERICAN: 110 mL/min/{1.73_m2} (ref >=60)
GFR, EST NON AFRICAN AMERICAN: 95 mL/min/{1.73_m2} (ref >=60)
GLUCOSE: 122 mg/dL — AB (ref 65–99)
Globulin: 4.1 g/dL (calc) — ABNORMAL HIGH (ref 1.9–3.7)
POTASSIUM: 3.8 mmol/L (ref 3.5–5.3)
Sodium: 135 mmol/L (ref 135–146)
TOTAL PROTEIN: 7.8 g/dL (ref 6.1–8.1)

## 2017-05-29 MED ORDER — LEVOFLOXACIN 500 MG PO TABS: 500.0000 mg | ORAL_TABLET | Freq: Every day | ORAL | 0 refills | Status: DC

## 2017-05-31 ENCOUNTER — Other Ambulatory Visit: Payer: Self-pay

## 2017-05-31 ENCOUNTER — Telehealth: Payer: Self-pay

## 2017-05-31 DIAGNOSIS — J189 Pneumonia, unspecified organism: Secondary | ICD-10-CM

## 2017-05-31 NOTE — Telephone Encounter (Signed)
Patient is requesting a rx for an inhaler she has an asthma Dr but is not scheduled to see them until 10-18 and need the inhaler before she go out of town

## 2017-06-03 ENCOUNTER — Other Ambulatory Visit: Payer: Self-pay | Admitting: Family Medicine

## 2017-06-03 MED ORDER — ALBUTEROL SULFATE HFA 108 (90 BASE) MCG/ACT IN AERS
2.0000 | INHALATION_SPRAY | Freq: Four times a day (QID) | RESPIRATORY_TRACT | 6 refills | Status: DC | PRN
Start: 2017-06-03 — End: 2017-11-13

## 2017-06-04 NOTE — Telephone Encounter (Signed)
Ok, but I ordered it Monday morning on epic at her pharm.  SHe may want to call them, but ok to refill.

## 2017-06-13 ENCOUNTER — Ambulatory Visit: Payer: BLUE CROSS/BLUE SHIELD | Admitting: Pulmonary Disease

## 2017-11-13 ENCOUNTER — Encounter: Payer: Self-pay | Admitting: Physician Assistant

## 2017-11-13 ENCOUNTER — Ambulatory Visit (INDEPENDENT_AMBULATORY_CARE_PROVIDER_SITE_OTHER): Payer: BLUE CROSS/BLUE SHIELD | Admitting: Physician Assistant

## 2017-11-13 ENCOUNTER — Other Ambulatory Visit: Payer: Self-pay

## 2017-11-13 VITALS — BP 122/74 | HR 86 | Temp 97.9°F | Resp 16 | Wt 153.2 lb

## 2017-11-13 DIAGNOSIS — M5432 Sciatica, left side: Secondary | ICD-10-CM

## 2017-11-13 MED ORDER — PREDNISONE 20 MG PO TABS: ORAL_TABLET | ORAL | 0 refills | Status: DC

## 2017-11-13 MED ORDER — CYCLOBENZAPRINE HCL 10 MG PO TABS: 10.0000 mg | ORAL_TABLET | Freq: Three times a day (TID) | ORAL | 0 refills | Status: DC | PRN

## 2017-11-13 NOTE — Progress Notes (Signed)
Patient ID: Diane Lee MRN: [id number], DOB: 01-16-1981, 37 y.o. Date of Encounter: 11/13/2017, 4:08 PM    Chief Complaint:  Chief Complaint  Patient presents with  . Back Pain    x4days     HPI: 37 y.o. year old female presents with above.   She reports that she has no prior history of any back pain prior to 2 months ago.  She reports that about 2 months ago she reached to the corner of the bed when making up the bed.  When she raised back up, she felt some back pain.    Then about 1 month ago she was at work (works as a Runner, broadcasting/film/videoteacher).  She reached for something high and when she came back down felt some back pain.  At that time, 1 month ago, started going to chiropractor.  She reports that she had done no heavy lifting or other strenuous activity around this time.  States that on this past Sunday morning--- when she woke up-- she was experiencing pain in her left buttock region going down the lateral aspect of the left leg.   Went back to her chiropractor on Monday.  Continuing to have significant pain in the left buttock region going down the left leg so came here for evaluation.     Home Meds:   Outpatient Medications Prior to Visit  Medication Sig Dispense Refill  . LO LOESTRIN FE 1 MG-10 MCG / 10 MCG tablet Take 1 tablet by mouth daily.  4  . Multiple Vitamin (MULTIVITAMIN) tablet Take 1 tablet by mouth daily.    Marland Kitchen. albuterol (PROVENTIL HFA;VENTOLIN HFA) 108 (90 Base) MCG/ACT inhaler Inhale 2 puffs into the lungs every 6 (six) hours as needed for wheezing or shortness of breath. 1 Inhaler 6  . cyclobenzaprine (FLEXERIL) 10 MG tablet Take 1 tablet (10 mg total) by mouth 3 (three) times daily as needed for muscle spasms. 30 tablet 0  . diclofenac (VOLTAREN) 75 MG EC tablet Take 1 tablet (75 mg total) by mouth 2 (two) times daily. 30 tablet 0  . levofloxacin (LEVAQUIN) 500 MG tablet Take 1 tablet (500 mg total) by mouth daily. 7 tablet 0   No facility-administered  medications prior to visit.     Allergies:  Allergies  Allergen Reactions  . Sulfa Antibiotics     Other reaction(s): Other Joint pain   . Bactrim Other (See Comments)    Pt states that this med causes joint pain, and her white blood cells and platelets to drop.      Review of Systems: See HPI for pertinent ROS. All other ROS negative.    Physical Exam: Blood pressure 122/74, pulse 86, temperature 97.9 F (36.6 C), temperature source Oral, resp. rate 16, weight 69.5 kg (153 lb 3.2 oz), last menstrual period 10/30/2017, SpO2 100 %., Body mass index is 22.62 kg/m. General:  WNWD WF.  Appears in no acute distress. Neck: Supple. No thyromegaly. No lymphadenopathy. Lungs: Clear bilaterally to auscultation without wheezes, rales, or rhonchi. Breathing is unlabored. Heart: Regular rhythm. No murmurs, rubs, or gallops. Msk:  Strength and tone normal for age. Left low back at ~ L5 Level is area of pain.  Positive tenderness with palpation to the left sciatic notch region. Extremities/Skin: Warm and dry.  Neuro: Alert and oriented X 3. Moves all extremities spontaneously. Gait is normal. CNII-XII grossly in tact. Psych:  Responds to questions appropriately with a normal affect.     ASSESSMENT AND PLAN:  37 y.o. year old female with  1. Left sciatic nerve pain Symptoms are consistent with muscle strain and sciatica.  Treat with a round of prednisone and muscle relaxer.  If symptoms do not resolve with this then she is to follow-up then would get x-ray for imaging.  She is aware that Flexeril can cause drowsiness and she plans to only take this at bedtime.  She will take the prednisone taper as directed and take the Flexeril at night.  If symptoms are improved then she will do some stretches.  States that her chiropractor had already shown given her stretches to do.  Also can apply heat.  Follow-up if symptoms do not resolve or if they recur. - predniSONE (DELTASONE) 20 MG tablet; Take 3  daily for 2 days, then 2 daily for 2 days, then 1 daily for 2 days.  Dispense: 12 tablet; Refill: 0 - cyclobenzaprine (FLEXERIL) 10 MG tablet; Take 1 tablet (10 mg total) by mouth 3 (three) times daily as needed for muscle spasms.  Dispense: 30 tablet; Refill: 0   Signed, [address] [place], [place], [facility] 11/13/2017 4:08 PM

## 2017-11-18 ENCOUNTER — Telehealth: Payer: Self-pay | Admitting: Family Medicine

## 2017-11-18 DIAGNOSIS — M5432 Sciatica, left side: Secondary | ICD-10-CM

## 2017-11-18 NOTE — Telephone Encounter (Signed)
We have to start with x-ray.

## 2017-11-18 NOTE — Telephone Encounter (Signed)
Obtain x-ray lumbar spine.

## 2017-11-18 NOTE — Telephone Encounter (Signed)
Call placed to patient and she informed me that the chiropractor feels it is  Nerve related and  maybe a MRI should be done, also  She was wondering if she should do rehab? Pls advise

## 2017-11-18 NOTE — Telephone Encounter (Signed)
Patient called [facility] stating that she has one more prednisone left and she is no better and would like to know what to do from here?

## 2017-11-19 ENCOUNTER — Ambulatory Visit (HOSPITAL_COMMUNITY)
Admission: RE | Admit: 2017-11-19 | Discharge: 2017-11-19 | Disposition: A | Discharge status: Home / Self Care | Payer: BLUE CROSS/BLUE SHIELD | Source: Ambulatory Visit | Attending: Physician Assistant | Admitting: Physician Assistant

## 2017-11-19 DIAGNOSIS — M5432 Sciatica, left side: Secondary | ICD-10-CM | POA: Diagnosis present

## 2017-11-19 DIAGNOSIS — M48061 Spinal stenosis, lumbar region without neurogenic claudication: Secondary | ICD-10-CM | POA: Insufficient documentation

## 2017-11-19 NOTE — Telephone Encounter (Signed)
Call placed to patient she is aware x ray need to be done first . Order has been placed

## 2017-11-20 ENCOUNTER — Other Ambulatory Visit: Payer: Self-pay

## 2017-11-20 DIAGNOSIS — M5432 Sciatica, left side: Secondary | ICD-10-CM

## 2017-11-20 DIAGNOSIS — M5136 Other intervertebral disc degeneration, lumbar region: Secondary | ICD-10-CM

## 2017-12-04 ENCOUNTER — Telehealth: Payer: Self-pay

## 2017-12-04 NOTE — Telephone Encounter (Signed)
Patient called and left a message stating her symptoms have improved with the sciatic pain and just has a little tingling in toes and back of legs. Patient states since her symptoms are improving do you still want her to get the MRI?

## 2017-12-04 NOTE — Telephone Encounter (Signed)
Since symptoms are improved, I would recommend that she just continue to monitor at this point and hold off on MRI. I recommend that she monitor her symptoms and come in for follow-up visit if has recurrent/ increased symptoms.

## 2017-12-04 NOTE — Telephone Encounter (Signed)
Call placed to patient  To discuss provider recommendations regarding MRI. Patient  states she took off work to have the MRI so she will decide to either keep the appointment or cancel.

## 2017-12-05 ENCOUNTER — Ambulatory Visit (HOSPITAL_COMMUNITY)
Admission: RE | Admit: 2017-12-05 | Discharge: 2017-12-05 | Disposition: A | Discharge status: Home / Self Care | Payer: BLUE CROSS/BLUE SHIELD | Source: Ambulatory Visit | Attending: Physician Assistant | Admitting: Physician Assistant

## 2017-12-05 DIAGNOSIS — M5137 Other intervertebral disc degeneration, lumbosacral region: Secondary | ICD-10-CM | POA: Insufficient documentation

## 2017-12-05 DIAGNOSIS — M48061 Spinal stenosis, lumbar region without neurogenic claudication: Secondary | ICD-10-CM | POA: Insufficient documentation

## 2017-12-05 DIAGNOSIS — M5126 Other intervertebral disc displacement, lumbar region: Secondary | ICD-10-CM | POA: Insufficient documentation

## 2017-12-05 DIAGNOSIS — M5432 Sciatica, left side: Secondary | ICD-10-CM | POA: Diagnosis present

## 2017-12-05 DIAGNOSIS — M5136 Other intervertebral disc degeneration, lumbar region: Secondary | ICD-10-CM | POA: Diagnosis present

## 2017-12-09 ENCOUNTER — Other Ambulatory Visit: Payer: Self-pay

## 2017-12-09 DIAGNOSIS — M5416 Radiculopathy, lumbar region: Secondary | ICD-10-CM

## 2017-12-09 DIAGNOSIS — M5136 Other intervertebral disc degeneration, lumbar region: Secondary | ICD-10-CM

## 2017-12-09 DIAGNOSIS — R937 Abnormal findings on diagnostic imaging of other parts of musculoskeletal system: Secondary | ICD-10-CM

## 2017-12-19 ENCOUNTER — Encounter (INDEPENDENT_AMBULATORY_CARE_PROVIDER_SITE_OTHER): Payer: Self-pay | Admitting: Orthopaedic Surgery

## 2017-12-19 ENCOUNTER — Ambulatory Visit (INDEPENDENT_AMBULATORY_CARE_PROVIDER_SITE_OTHER): Payer: BLUE CROSS/BLUE SHIELD | Admitting: Orthopaedic Surgery

## 2017-12-19 VITALS — BP 129/86 | HR 91 | Ht 69.0 in | Wt 142.0 lb

## 2017-12-19 DIAGNOSIS — M5126 Other intervertebral disc displacement, lumbar region: Secondary | ICD-10-CM | POA: Diagnosis not present

## 2017-12-19 NOTE — Progress Notes (Signed)
Office Visit Note/orthopedic consultation   Patient: Diane Lee           Date of Birth: 08/31/1980           MRN: [id number] Visit Date: 12/19/2017              Requested by: Diane Brooks, MD [address] [name], [place] [address] PCP: Diane Brooks, MD   Assessment & Plan: Visit Diagnoses:  1. Protrusion of lumbar intervertebral disc         Left L4-5 with caudal extrusion  Plan:  I gave her a copy of the report.  Left L4-5  caudal disc extrusion  With left lateral recess stenosis..  Treatment options discussed ESI  versus microdiscectomy.  He had significant increase in symptoms prior to 1 month she will call.  Thank you for the opportunity to see her for consultation.  Follow-Up Instructions: Return in about 1 month (around 01/18/2018).   Orders:  No orders of the defined types were placed in this encounter.  No orders of the defined types were placed in this encounter.     Procedures: No procedures performed   Clinical Data: No additional findings.   Subjective: Chief Complaint  Patient presents with  . Lower Back - Pain    HPI 37 year old female rotation for low back pain and L4-5 left disc protrusion with radicular symptoms.  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  Office note 11/13/2017 with Diane Butcher, PA-C noted onset with reaching to the corner of the bed making up the bed with increased pain.  She was treated by a chiropractor got somewhat better than pain tended to get worse radiated down her leg to the dorsum of her foot.  Plain radiographs were initially obtained followed by MRI scan which showed left L4-5 disc protrusion.  She was not getting better stopped 2 weeks ago.  Is to have numbness and tingling.  She still able to work and walk.  Associated bowel or bladder symptoms no fever or chills.  Treated with muscle relaxants, chiropractic treatment,  prednisone pack.  Some improvement with the prednisone pack.  Review of Systems 14 point review of system positive for asthma, bronchitis, seasonal allergies.  Dry eyes.  She takes multivitamins and birth control pills.  She does not smoke.  She has had issues with low platelet count.  Childbirth times 09/2007, 2013 without complications.   Objective: Vital Signs: BP 129/86   Pulse 91   Ht 5\' 9"  (1.753 m)   Wt 142 lb (64.4 kg)   BMI 20.97 kg/m   Physical Exam  Constitutional: She is oriented to person, place, and time. She appears well-developed.  HENT:  Head: Normocephalic.  Right Ear: External ear normal.  Left Ear: External ear normal.  Eyes: Pupils are equal, round, and reactive to light.  Neck: No tracheal deviation present. No thyromegaly present.  Cardiovascular: Normal rate.  Pulmonary/Chest: Effort normal.  Abdominal: Soft.  Neurological: She is alert and oriented to person, place, and time.  Skin: Skin is warm and dry.  Psychiatric: She has a normal mood and affect. Her behavior is normal.    Ortho Exam toe walk.  She has some sciatic notch tenderness the left negative on the right.  Straight leg raising positive at 70 degrees on the left negative on the right.  Some pain  with popliteal compression.  Normal hip range of motion SI joint testing is normal knee and ankle jerk is intact.  [name] is strong she has some weakness testing on the left as well as EHL.  Normal on the right.  Specialty Comments:  No specialty comments available.  Imaging: CLINICAL DATA:  37 year old female with lumbar back pain radiating down the left leg for 1 month. No known injury.  EXAM: MRI LUMBAR SPINE WITHOUT CONTRAST  TECHNIQUE: Multiplanar, multisequence MR imaging of the lumbar spine was performed. No intravenous contrast was administered.  COMPARISON:  Lumbar radiographs 11/19/2017.  FINDINGS: Segmentation: Normal on the comparison radiographs. Full size ribs at  T12.  Alignment: Stable vertebral height and alignment with straightening of lower lumbar lordosis, but exaggerated lordosis and perhaps mild retrolisthesis of L5 on S1.  Vertebrae: Visualized bone marrow signal is within normal limits. No marrow edema or evidence of acute osseous abnormality. Intact visible sacrum and SI joints.  Conus medullaris and cauda equina: Conus extends to the T12 level. No lower spinal cord or conus signal abnormality.  Paraspinal and other soft tissues: Visualized abdominal viscera and paraspinal soft tissues are within normal limits.  Disc levels:  T11-T12: Negative.  T12-L1:  Negative.  L1-L2:  Negative.  L2-L3:  Negative.  L3-L4:  Negative.  L4-L5: Disc desiccation. Mild circumferential disc bulge. There is a superimposed central and leftward caudal disc extrusion best demonstrated on series 3, image 10 and series 6, images 27 and 28. Underlying mild facet hypertrophy. No significant spinal stenosis, but severe left lateral recess stenosis at the level of the descending left L5 nerve roots. No left L4 foraminal stenosis.  L5-S1: Disc desiccation with circumferential and mild broad-based posterior disc bulging. Mild facet hypertrophy. No significant stenosis.  IMPRESSION: 1. L4-L5 disc degeneration with leftward caudal disc extrusion causing severe left lateral recess stenosis. Query left L5 radiculitis. 2. L5-S1 disc degeneration without stenosis.   Electronically Signed   By: Diane FlemingH  Diane LeeD.   On: 12/05/2017 15:09   PMFS History: Patient Active Problem List   Diagnosis Date Noted  . Mild intermittent asthma 08/12/2015  . Abnormal Pap smear   . Thrombocytopenic, cyclic (HCC)    Past Medical History:  Diagnosis Date  . Abnormal Pap smear   . Thrombocytopenic, cyclic (HCC)    Was borderline, has been cleared by MD    Family History  Problem Relation Age of Onset  . Breast cancer Maternal Grandmother   .  Prostate cancer Father     Past Surgical History:  Procedure Laterality Date  . CERVICAL BIOPSY     Benign  . TONSILLECTOMY    . VAGINAL DELIVERY    . WISDOM TOOTH EXTRACTION     Social History   Occupational History  . Occupation: Runner, broadcasting/film/videoteacher  Tobacco Use  . Smoking status: Never Smoker  . Smokeless tobacco: Never Used  Substance and Sexual Activity  . Alcohol use: No  . Drug use: No  . Sexual activity: Yes

## 2017-12-26 ENCOUNTER — Encounter (INDEPENDENT_AMBULATORY_CARE_PROVIDER_SITE_OTHER): Payer: Self-pay | Admitting: Orthopaedic Surgery

## 2018-01-16 ENCOUNTER — Ambulatory Visit (INDEPENDENT_AMBULATORY_CARE_PROVIDER_SITE_OTHER): Payer: BLUE CROSS/BLUE SHIELD | Admitting: Orthopaedic Surgery

## 2018-07-16 ENCOUNTER — Encounter: Payer: Self-pay | Admitting: Family Medicine

## 2018-07-16 ENCOUNTER — Ambulatory Visit (INDEPENDENT_AMBULATORY_CARE_PROVIDER_SITE_OTHER): Payer: BLUE CROSS/BLUE SHIELD | Admitting: Family Medicine

## 2018-07-16 VITALS — BP 120/74 | HR 90 | Temp 97.8°F | Resp 14 | Ht 69.0 in | Wt 149.0 lb

## 2018-07-16 DIAGNOSIS — J069 Acute upper respiratory infection, unspecified: Secondary | ICD-10-CM

## 2018-07-16 DIAGNOSIS — J4521 Mild intermittent asthma with (acute) exacerbation: Secondary | ICD-10-CM

## 2018-07-16 MED ORDER — ALBUTEROL SULFATE HFA 108 (90 BASE) MCG/ACT IN AERS: 2.0000 | INHALATION_SPRAY | RESPIRATORY_TRACT | 2 refills | Status: DC | PRN

## 2018-07-16 MED ORDER — PREDNISONE 20 MG PO TABS: 40.0000 mg | ORAL_TABLET | Freq: Every day | ORAL | 0 refills | Status: AC

## 2018-07-16 MED ORDER — MONTELUKAST SODIUM 10 MG PO TABS: 10.0000 mg | ORAL_TABLET | Freq: Every day | ORAL | 3 refills | Status: DC

## 2018-07-16 MED ORDER — ALBUTEROL SULFATE HFA 108 (90 BASE) MCG/ACT IN AERS: 2.0000 | INHALATION_SPRAY | RESPIRATORY_TRACT | 0 refills | Status: DC | PRN

## 2018-07-16 MED ORDER — GUAIFENESIN ER 600 MG PO TB12: 600.0000 mg | ORAL_TABLET | Freq: Two times a day (BID) | ORAL | 0 refills | Status: AC

## 2018-07-16 NOTE — Patient Instructions (Addendum)
To help control seasonal allergies that may trigger you - take daily an antihistamine and steroid nasal spray like flonase or nasonex (over the counter) Can also add singulair prescription if your symptoms continue to be bothersome.    Start steroids today and take in the morning for the next 5 days.  Use the albuterol inhaler every 4-6 hours as needed for wheeze, tight coughing fits, shortness of breath.  The medicine only lasts in your lungs for about 2 hours, so in the first 1-2 days it is okay to use your albuterol inhaler every 2 hours, and then as the steroids begin to work, you should only need it every 4-6 hours or less.  *If you are having any severe difficulty breathing or wheeze, you can use the albuterol inhaler, 2 puffs every 20 minutes, and can repeat 2 times however if you are still distressed having try the inhaler 3 times within 1 hour you need to call 911 or go to the ER.  Take Mucinex daily, as directed on box or bottle, and drink ample water.

## 2018-07-16 NOTE — Progress Notes (Signed)
Patient ID: Diane Lee, female    DOB: 02/05/1981, 37 y.o.   MRN: [id number]  PCP: Donita Brooks, MD  Chief Complaint  Patient presents with  . Chest congestion and cough    Subjective:   Diane Lee is a 37 y.o. female, presents to clinic with CC of 1 week of URI symptoms and nonproductive cough with chest congestion and asthma exacerbation.  She is taking a Zyrtec daily and does not currently have an inhaler.  She has had shortness of breath, wheeze and intermittent coughing fits.  Her cough is worse first in the morning, yesterday she was very short of breath.  She typically has asthma only in the fall when the seasons change and she gets sick or has some allergies, this is fairly consistent for her history of asthma recently.  She sometimes needs steroids to treat. She continues to have some nasal congestion and postnasal drip nasal discharge.  She denies any headaches, severe sinus pain or pressure, fever, sweats, chest pain, rash.   Patient Active Problem List   Diagnosis Date Noted  . Mild intermittent asthma 08/12/2015  . Abnormal Pap smear   . Thrombocytopenic, cyclic (HCC)      Prior to Admission medications   Medication Sig Start Date End Date Taking? Authorizing Provider  albuterol (PROVENTIL HFA;VENTOLIN HFA) 108 (90 Base) MCG/ACT inhaler Inhale 2 puffs into the lungs every 4 (four) hours as needed for wheezing or shortness of breath. 07/16/18   Danelle Berry, PA-C  cetirizine (ZYRTEC) 10 MG tablet Take 10 mg by mouth daily.    [provider]  cycloSPORINE (RESTASIS) 0.05 % ophthalmic emulsion 1 drop 2 (two) times daily.    [provider]  guaiFENesin (MUCINEX) 600 MG 12 hr tablet Take 1 tablet (600 mg total) by mouth 2 (two) times daily for 7 days. 07/16/18 07/23/18  Danelle Berry, PA-C  LO LOESTRIN FE 1 MG-10 MCG / 10 MCG tablet Take 1 tablet by mouth daily. 07/19/15   [provider]  montelukast (SINGULAIR) 10 MG tablet Take 1  tablet (10 mg total) by mouth at bedtime. 07/16/18   Danelle Berry, PA-C  Multiple Vitamin (MULTIVITAMIN) tablet Take 1 tablet by mouth daily.    [provider]  predniSONE (DELTASONE) 20 MG tablet Take 2 tablets (40 mg total) by mouth daily with breakfast for 5 days. 07/16/18 07/21/18  Danelle Berry, PA-C     Allergies  Allergen Reactions  . Sulfa Antibiotics     Other reaction(s): Other Joint pain   . Bactrim Other (See Comments)    Pt states that this med causes joint pain, and her white blood cells and platelets to drop.     Family History  Problem Relation Age of Onset  . Breast cancer Maternal Grandmother   . Prostate cancer Father      Social History   Socioeconomic History  . Marital status: Married    Spouse name: Not on file  . Number of children: Not on file  . Years of education: Not on file  . Highest education level: Not on file  Occupational History  . Occupation: Runner, broadcasting/film/video  Social Needs  . Financial resource strain: Not on file  . Food insecurity:    Worry: Not on file    Inability: Not on file  . Transportation needs:    Medical: Not on file    Non-medical: Not on file  Tobacco Use  . Smoking status: Never Smoker  .  Smokeless tobacco: Never Used  Substance and Sexual Activity  . Alcohol use: No  . Drug use: No  . Sexual activity: Yes  Lifestyle  . Physical activity:    Days per week: Not on file    Minutes per session: Not on file  . Stress: Not on file  Relationships  . Social connections:    Talks on phone: Not on file    Gets together: Not on file    Attends religious service: Not on file    Active member of club or organization: Not on file    Attends meetings of clubs or organizations: Not on file    Relationship status: Not on file  . Intimate partner violence:    Fear of current or ex partner: Not on file    Emotionally abused: Not on file    Physically abused: Not on file    Forced sexual activity: Not on file  Other  Topics Concern  . Not on file  Social History Narrative  . Not on file     Review of Systems  Constitutional: Negative.   HENT: Negative.   Eyes: Negative.   Respiratory: Negative.   Cardiovascular: Negative.   Gastrointestinal: Negative.   Endocrine: Negative.   Genitourinary: Negative.   Musculoskeletal: Negative.   Skin: Negative.   Allergic/Immunologic: Negative.   Neurological: Negative.   Hematological: Negative.   Psychiatric/Behavioral: Negative.   All other systems reviewed and are negative.      Objective:    Vitals:   07/16/18 0939  BP: 120/74  Pulse: 90  Resp: 14  Temp: 97.8 F (36.6 C)  TempSrc: Oral  SpO2: 99%  Weight: 149 lb (67.6 kg)  Height: 5\' 9"  (1.753 m)      Physical Exam  Constitutional: She appears well-developed and well-nourished. No distress.  HENT:  Head: Normocephalic and atraumatic.  Mouth/Throat: Oropharynx is clear and moist. No oropharyngeal exudate.  Nasal congestion, nasal mucosa is erythematous and moderately edematous with discharge No sinus tenderness to palpation  Eyes: Pupils are equal, round, and reactive to light. Conjunctivae are normal. Right eye exhibits no discharge. Left eye exhibits no discharge.  Neck: Normal range of motion. Neck supple. No tracheal deviation present.  Cardiovascular: Normal rate, regular rhythm, normal heart sounds and intact distal pulses. Exam reveals no gallop and no friction rub.  No murmur heard. Pulmonary/Chest: Effort normal. No stridor. No respiratory distress. She has wheezes. She has no rales. She exhibits no tenderness.  Musculoskeletal: Normal range of motion.  Lymphadenopathy:    She has no cervical adenopathy.  Neurological: She is alert. She exhibits normal muscle tone. Coordination normal.  Skin: Skin is warm and dry. No rash noted. She is not diaphoretic.  Psychiatric: She has a normal mood and affect. Her behavior is normal.  Nursing note and vitals reviewed.           Assessment & Plan:      ICD-10-CM   1. Mild intermittent asthma with acute exacerbation J45.21   2. Upper respiratory tract infection, unspecified type J70.37      37 year old female presents with 1 week of URI and mild asthma exacerbation.  She has some faint wheeze at the end of inspiration, no exp wheeze, no distress/retractions/accessory muscle use.  She felt much more short of breath and wheezy yesterday and feels a little bit better today but she does need a refill on her inhaler.  Because I can hear wheeze today while she feels better I  do think she needs a short burst of steroids with inhaler, and have educated her about allergy control to help prevent her asthma exacerbation.  If she does not improve with antihistamines, steroid nasal sprays, steroids and inhaler and has difficulty controlling asthma flare she was encouraged to start Singulair.  Follow-up as needed  Nasal congestion believe is viral in nature, encourage patient to follow-up with any acute worsening with pain or fever -need to be rechecked for acute bacterial sinusitis.  Danelle BerryLeisa Taheem Fricke, PA-C 07/16/18 10:05 AM

## 2018-10-02 ENCOUNTER — Other Ambulatory Visit: Payer: Self-pay | Admitting: Family Medicine

## 2018-10-02 ENCOUNTER — Telehealth: Payer: Self-pay | Admitting: Family Medicine

## 2018-10-02 MED ORDER — OSELTAMIVIR PHOSPHATE 75 MG PO CAPS: 75.0000 mg | ORAL_CAPSULE | Freq: Every day | ORAL | 0 refills | Status: DC

## 2018-10-02 NOTE — Telephone Encounter (Signed)
done

## 2018-10-02 NOTE — Telephone Encounter (Signed)
Patient calling to say that her son has been diagnosed with the flu, would like to know if she can get tamiflu called in  To [facility] [place] [facility]

## 2019-05-29 ENCOUNTER — Other Ambulatory Visit: Payer: Self-pay

## 2019-05-29 DIAGNOSIS — Z20822 Contact with and (suspected) exposure to covid-19: Secondary | ICD-10-CM

## 2019-05-30 LAB — NOVEL CORONAVIRUS, NAA: SARS-CoV-2, NAA: NOT DETECTED

## 2019-06-16 ENCOUNTER — Other Ambulatory Visit: Payer: Self-pay

## 2019-06-16 MED ORDER — ALBUTEROL SULFATE HFA 108 (90 BASE) MCG/ACT IN AERS: 2.0000 | INHALATION_SPRAY | RESPIRATORY_TRACT | 0 refills | Status: DC | PRN

## 2019-06-16 MED ORDER — MONTELUKAST SODIUM 10 MG PO TABS: 10.0000 mg | ORAL_TABLET | Freq: Every day | ORAL | 1 refills | Status: DC

## 2019-07-01 IMAGING — DX DG LUMBAR SPINE COMPLETE 4+V
5 series · 5 of 5 positions shown · non-contrast
Comparison: None in PACs

CLINICAL DATA: Two weeks of left-sided sciatic symptoms with
tingling in his toes. No known injury.

EXAM:
LUMBAR SPINE - COMPLETE 4+ VIEW

[l-spine ap]
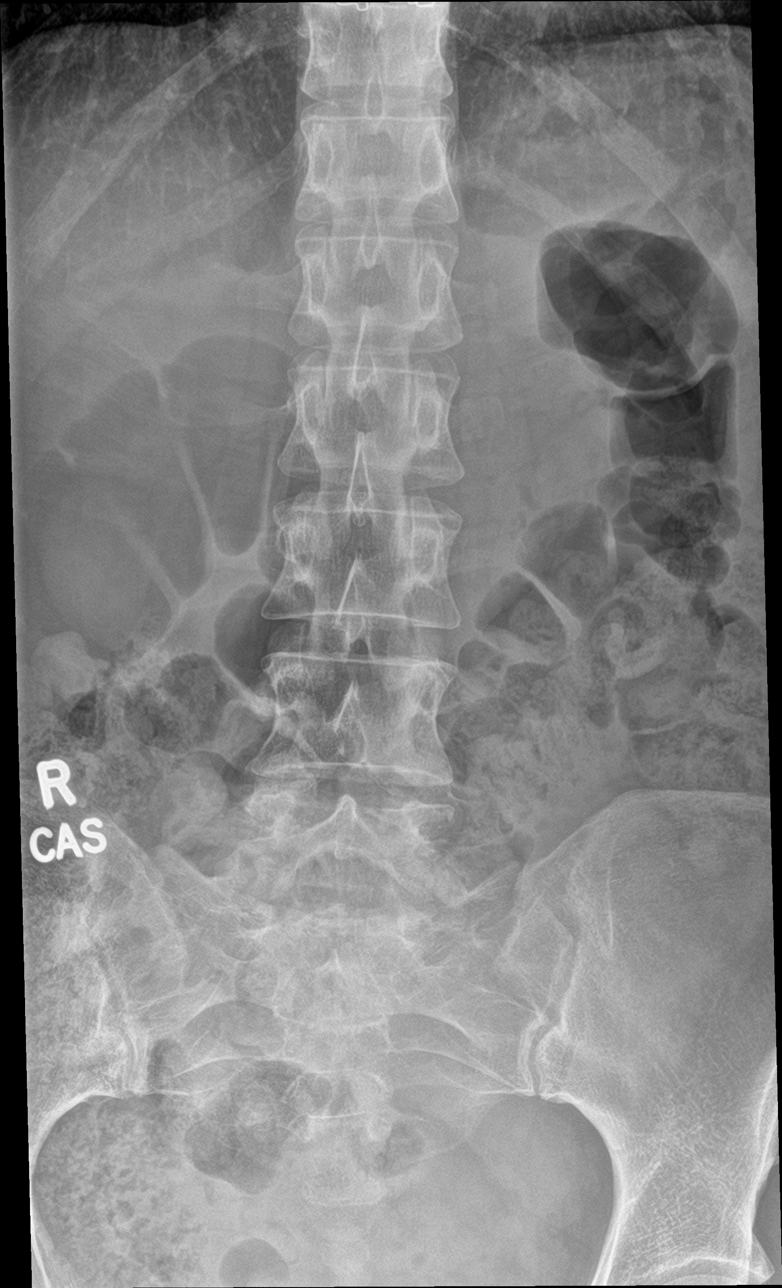

[l-spine obl (1 of 2)]
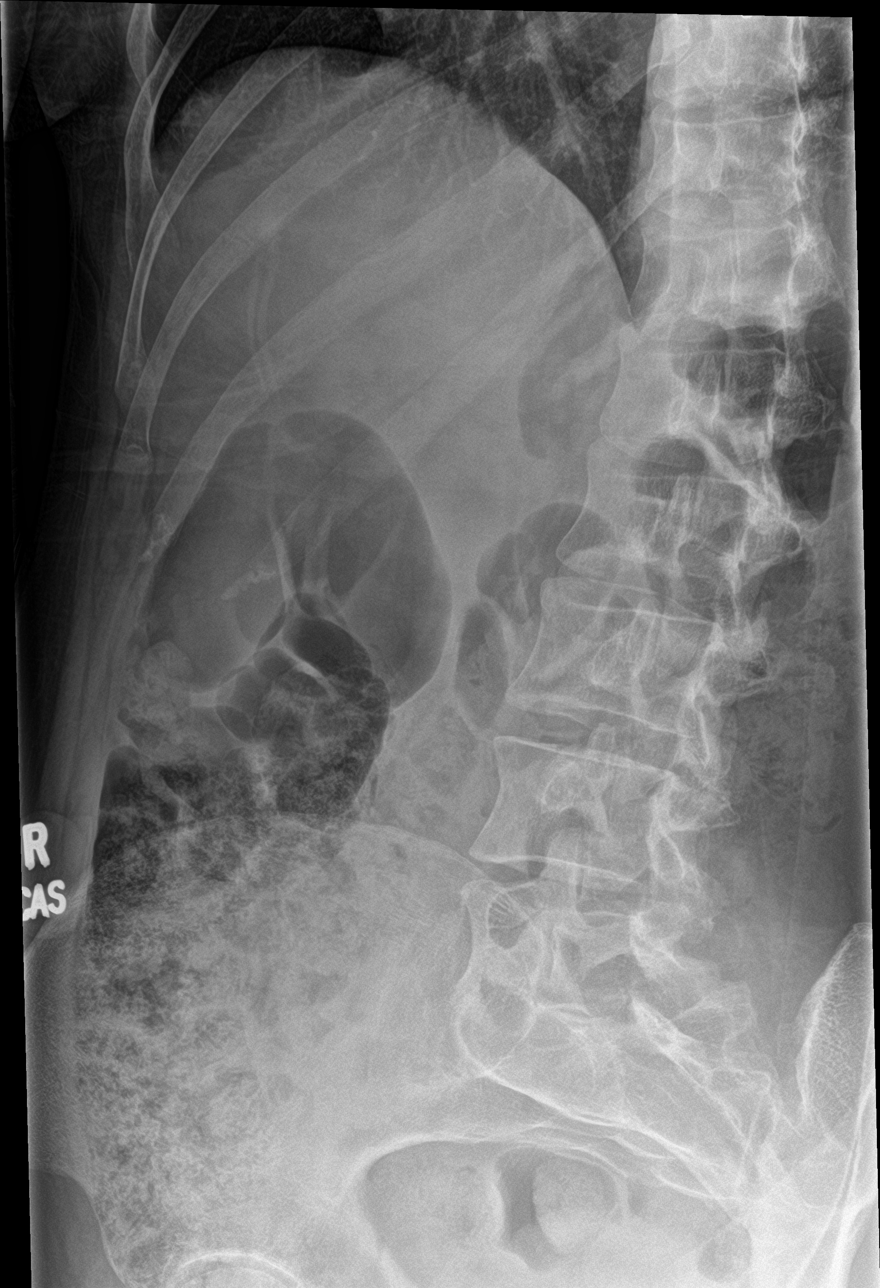

[l-spine obl (2 of 2)]
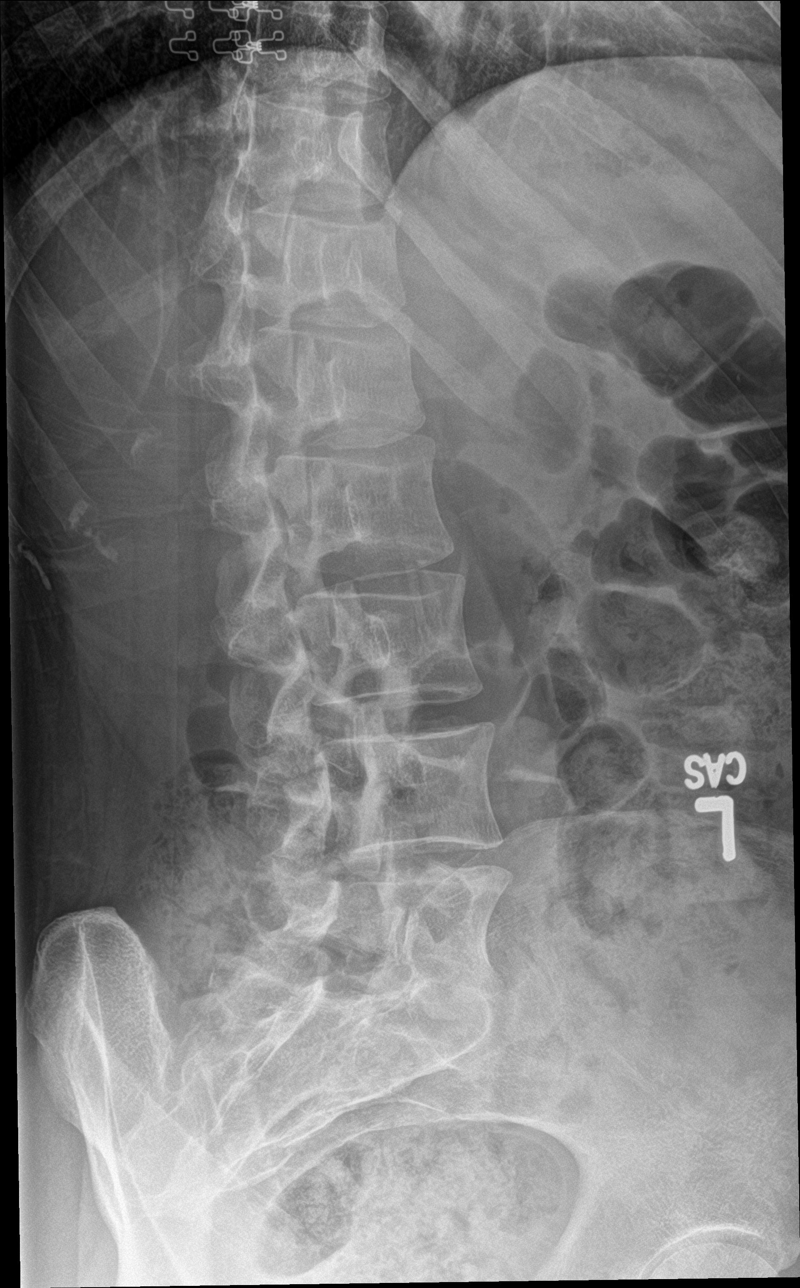

[l-spine lat]
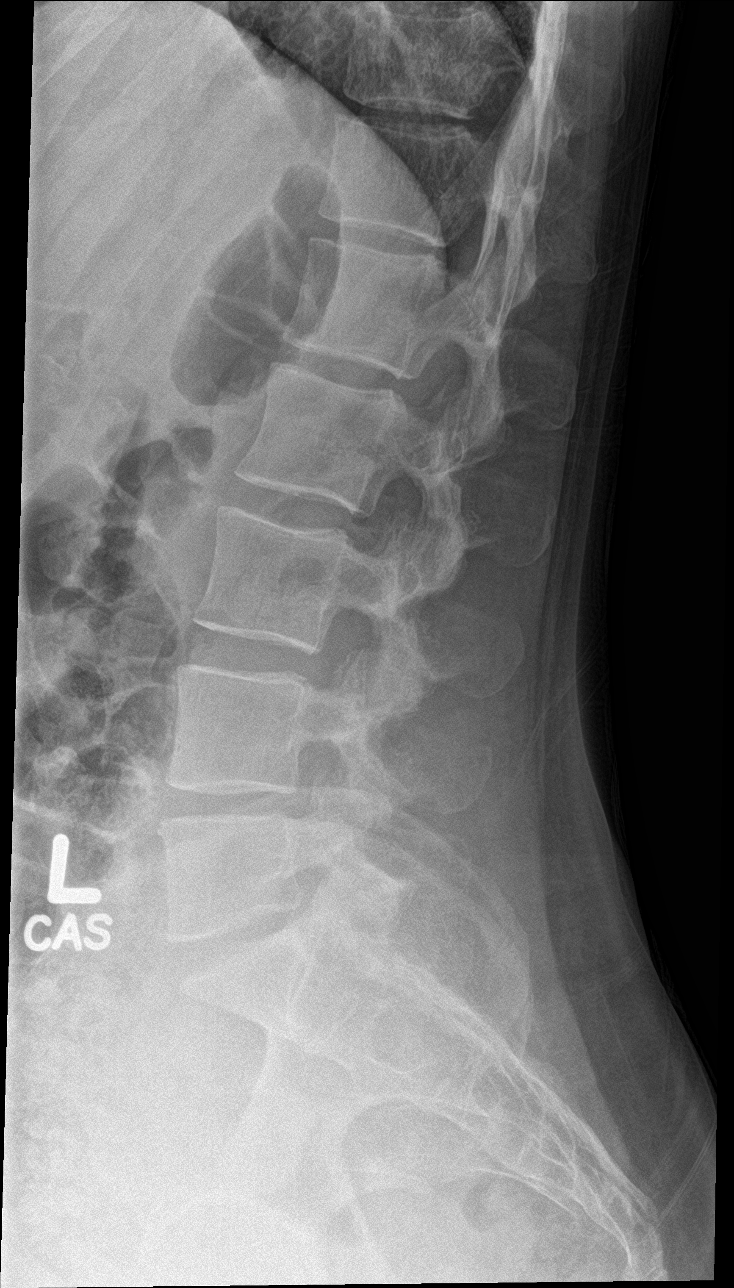

[l-spine spot]
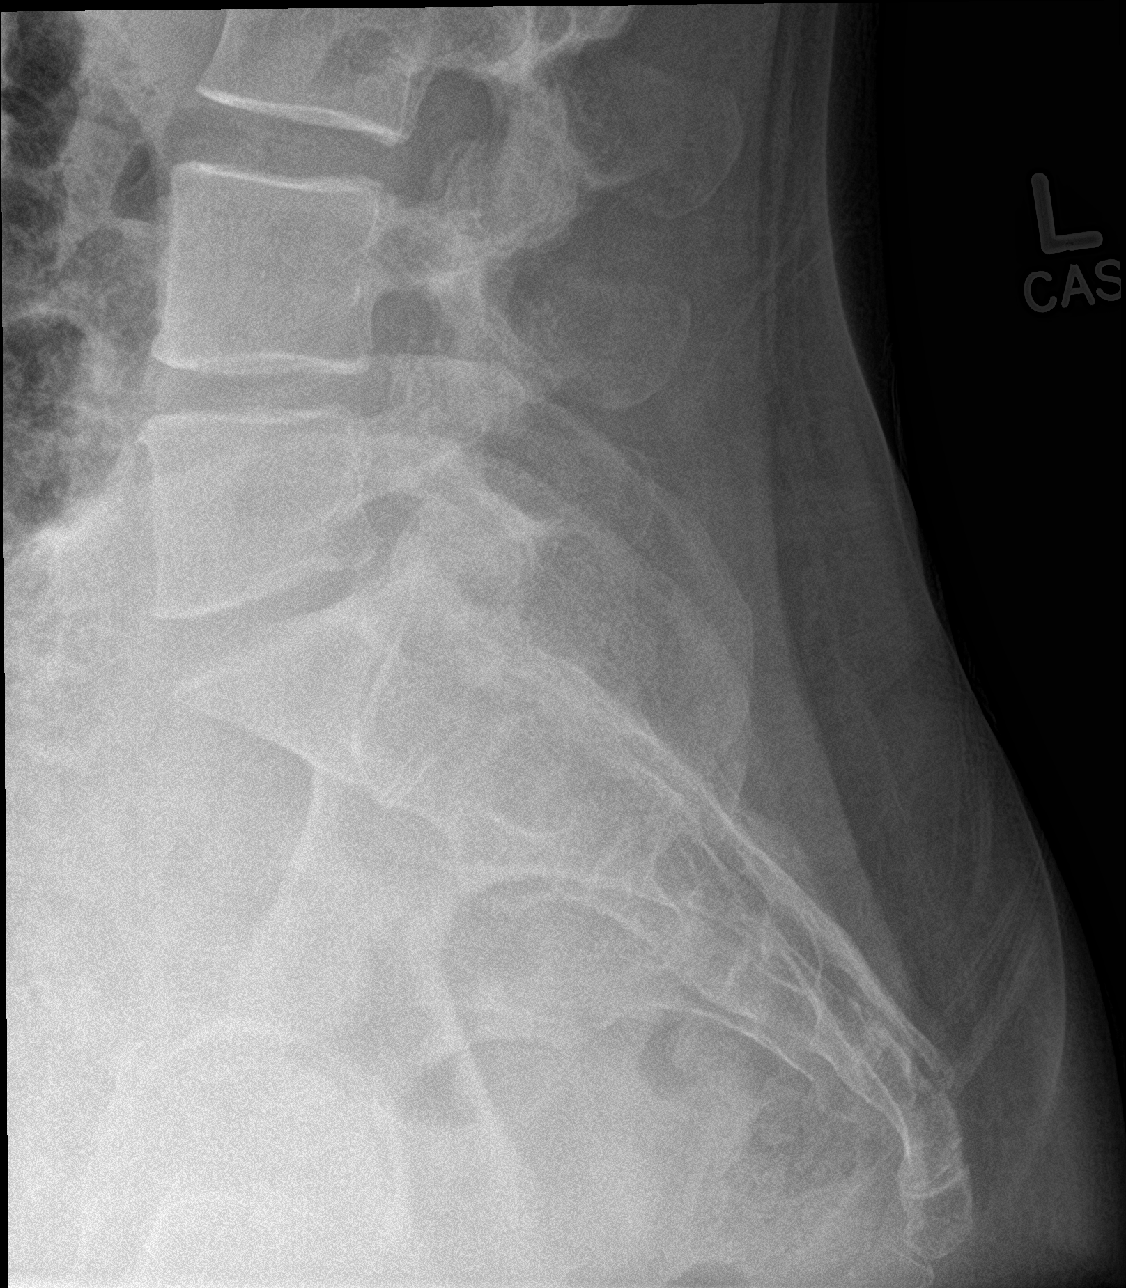

[5 of 5 positions shown; findings below may reference images not displayed]

FINDINGS: The lumbar vertebral bodies are preserved in height. There is
moderate disc space narrowing at L4-5. There is no
spondylolisthesis. There is no significant facet joint hypertrophy.
The pedicles and transverse processes are intact. The observed
portions of the sacrum are normal.
IMPRESSION: Moderate disc space narrowing at L4-5. No compression fracture or
spondylolisthesis.

## 2019-07-04 ENCOUNTER — Other Ambulatory Visit: Payer: Self-pay | Admitting: Family Medicine

## 2019-07-08 ENCOUNTER — Other Ambulatory Visit: Payer: Self-pay | Admitting: Family Medicine

## 2019-07-24 ENCOUNTER — Other Ambulatory Visit: Payer: Self-pay | Admitting: Family Medicine

## 2019-08-12 ENCOUNTER — Other Ambulatory Visit: Payer: Self-pay

## 2019-08-12 ENCOUNTER — Ambulatory Visit: Payer: BC Managed Care – PPO | Attending: Internal Medicine

## 2019-08-12 DIAGNOSIS — Z20822 Contact with and (suspected) exposure to covid-19: Secondary | ICD-10-CM

## 2019-08-13 LAB — NOVEL CORONAVIRUS, NAA: SARS-CoV-2, NAA: NOT DETECTED

## 2019-12-21 ENCOUNTER — Other Ambulatory Visit: Payer: Self-pay | Admitting: Family Medicine

## 2020-01-24 ENCOUNTER — Other Ambulatory Visit: Payer: Self-pay | Admitting: Family Medicine

## 2020-07-29 ENCOUNTER — Other Ambulatory Visit: Payer: Self-pay | Admitting: Obstetrics and Gynecology

## 2020-07-29 DIAGNOSIS — Z1231 Encounter for screening mammogram for malignant neoplasm of breast: Secondary | ICD-10-CM

## 2020-09-30 ENCOUNTER — Ambulatory Visit: Payer: BC Managed Care – PPO

## 2021-07-26 ENCOUNTER — Telehealth (INDEPENDENT_AMBULATORY_CARE_PROVIDER_SITE_OTHER): Payer: BC Managed Care – PPO | Admitting: Nurse Practitioner

## 2021-07-26 ENCOUNTER — Other Ambulatory Visit: Payer: Self-pay

## 2021-07-26 ENCOUNTER — Encounter: Payer: Self-pay | Admitting: Nurse Practitioner

## 2021-07-26 DIAGNOSIS — J4521 Mild intermittent asthma with (acute) exacerbation: Secondary | ICD-10-CM | POA: Diagnosis not present

## 2021-07-26 DIAGNOSIS — H109 Unspecified conjunctivitis: Secondary | ICD-10-CM

## 2021-07-26 DIAGNOSIS — J069 Acute upper respiratory infection, unspecified: Secondary | ICD-10-CM

## 2021-07-26 MED ORDER — PROMETHAZINE-DM 6.25-15 MG/5ML PO SYRP: 5.0000 mL | ORAL_SOLUTION | Freq: Four times a day (QID) | ORAL | 0 refills | Status: DC | PRN

## 2021-07-26 MED ORDER — PREDNISONE 20 MG PO TABS: 40.0000 mg | ORAL_TABLET | Freq: Every day | ORAL | 0 refills | Status: AC

## 2021-07-26 MED ORDER — ALBUTEROL SULFATE HFA 108 (90 BASE) MCG/ACT IN AERS: 2.0000 | INHALATION_SPRAY | RESPIRATORY_TRACT | 3 refills | Status: DC | PRN

## 2021-07-26 NOTE — Progress Notes (Signed)
Subjective:    Patient ID: Diane Lee, female    DOB: April 14, 1981, 40 y.o.   MRN: [id number]  HPI: Diane Lee is a 40 y.o. female presenting virtually for illness.  Chief Complaint  Patient presents with   URI   UPPER RESPIRATORY TRACT INFECTION Did virtual visit on Saturday and started Tessalon perles.  Went to urgent care on Monday and was prescribed eye drops but has not been able to fill because pharmacy does not have in stock.  Onset: 9 days ago Fever: no Body aches: yes Cough: yes; productive - worse the past 2 days Shortness of breath: no Wheezing: no Chest pain: yes, with cough Chest tightness: yes Chest congestion: yes Nasal congestion: no Runny nose: yes Post nasal drip: yes Sneezing: no Sore throat: no Swollen glands: no Sinus pressure: no Headache:  yes; right side Face pain: no Toothache: no Ear pain: no  Ear pressure: no  Eyes red/itching:yes; left eye Eye drainage/crusting: yes; left eye was matted shut this morning Nausea: no  Vomiting: no Diarrhea: no  Change in appetite: yes  Loss of taste/smell: no  Rash: no Fatigue: yes Sick contacts: yes; daughter was sick 2 weeks ago Strep contacts: no  Context: worse Recurrent sinusitis: no Treatments attempted: warm compresses, Advil, Tessalon perles, Mucinex -D  Relief with OTC medications: some  Allergies  Allergen Reactions   Sulfa Antibiotics     Other reaction(s): Other Joint pain    Bactrim Other (See Comments)    Pt states that this med causes joint pain, and her white blood cells and platelets to drop.    Outpatient Encounter Medications as of 07/26/2021  Medication Sig Note   predniSONE (DELTASONE) 20 MG tablet Take 2 tablets (40 mg total) by mouth daily with breakfast for 5 days.    promethazine-dextromethorphan (PROMETHAZINE-DM) 6.25-15 MG/5ML syrup Take 5 mLs by mouth 4 (four) times daily as needed for cough.    albuterol (VENTOLIN HFA) 108 (90 Base) MCG/ACT inhaler Inhale 2  puffs into the lungs every 4 (four) hours as needed for wheezing or shortness of breath.    benzonatate (TESSALON) 200 MG capsule Take 200 mg by mouth 3 (three) times daily as needed.    cetirizine (ZYRTEC) 10 MG tablet Take 10 mg by mouth daily.    cycloSPORINE (RESTASIS) 0.05 % ophthalmic emulsion 1 drop 2 (two) times daily.    LO LOESTRIN FE 1 MG-10 MCG / 10 MCG tablet Take 1 tablet by mouth daily. 08/12/2015: Received from: External Pharmacy Received Sig: TAKE 1 TABLET DAILY AS DIRECTED.   Multiple Vitamin (MULTIVITAMIN) tablet Take 1 tablet by mouth daily.    [DISCONTINUED] albuterol (VENTOLIN HFA) 108 (90 Base) MCG/ACT inhaler INHALE 2 PUFFS INTO THE LUNGS EVERY 4 (FOUR) HOURS AS NEEDED FOR WHEEZING OR SHORTNESS OF BREATH.    [DISCONTINUED] montelukast (SINGULAIR) 10 MG tablet TAKE 1 TABLET BY MOUTH EVERYDAY AT BEDTIME    [DISCONTINUED] oseltamivir (TAMIFLU) 75 MG capsule Take 1 capsule (75 mg total) by mouth daily.    No facility-administered encounter medications on file as of 07/26/2021.    Patient Active Problem List   Diagnosis Date Noted   Mild intermittent asthma 08/12/2015   Abnormal Pap smear    Thrombocytopenic, cyclic (HCC)     Past Medical History:  Diagnosis Date   Abnormal Pap smear    Thrombocytopenic, cyclic (HCC)    Was borderline, has been cleared by MD    Relevant past medical, surgical, family and  social history reviewed and updated as indicated. Interim medical history since our last visit reviewed.  Review of Systems Per HPI unless specifically indicated above     Objective:    LMP 07/12/2021 (Approximate)   Wt Readings from Last 3 Encounters:  07/16/18 149 lb (67.6 kg)  12/19/17 142 lb (64.4 kg)  11/13/17 153 lb 3.2 oz (69.5 kg)    Physical Exam Vitals and nursing note reviewed.  Constitutional:      General: She is not in acute distress.    Appearance: Normal appearance. She is not toxic-appearing.  HENT:     Head: Normocephalic and  atraumatic.     Right Ear: External ear normal.     Left Ear: External ear normal.     Nose: Congestion present. No rhinorrhea.     Mouth/Throat:     Mouth: Mucous membranes are moist.     Pharynx: Oropharynx is clear.  Eyes:     General:        Left eye: Discharge present.    Conjunctiva/sclera:     Left eye: Left conjunctiva is injected. Exudate present. No hemorrhage. Cardiovascular:     Comments: Unable to assess breath sounds via virtual visit.  Pulmonary:     Effort: Pulmonary effort is normal. No respiratory distress.     Comments: Unable to assess breath sounds via virtual visit.  Patient talking in complete sentences during telemedicine visit.  No accessory muscle use. Musculoskeletal:     Cervical back: Normal range of motion.  Skin:    Coloration: Skin is not jaundiced or pale.     Findings: No erythema.  Neurological:     Mental Status: She is alert and oriented to person, place, and time.  Psychiatric:        Mood and Affect: Mood normal.        Behavior: Behavior normal.        Thought Content: Thought content normal.        Judgment: Judgment normal.     Assessment & Plan:  1. Mild intermittent asthma with exacerbation Acute.  Start prednisone 40 mg daily for 5 day.  Refill sent in for rescue inhaler.  Follow up if symptoms not improving by Friday and we can check a chest x-ray.   - albuterol (VENTOLIN HFA) 108 (90 Base) MCG/ACT inhaler; Inhale 2 puffs into the lungs every 4 (four) hours as needed for wheezing or shortness of breath.  Dispense: 6.7 g; Refill: 3 - predniSONE (DELTASONE) 20 MG tablet; Take 2 tablets (40 mg total) by mouth daily with breakfast for 5 days.  Dispense: 10 tablet; Refill: 0  2. Viral upper respiratory tract infection Acute.  Symptoms sound viral in etiology.  Continue symptomatic management.  Push fluids, nasal rinses.  Start cough suppressant - ordered today.  No s/s bacterial infection today.  Start prednisone for asthma exacerbation.   Follow up by Friday with no improvement and we can check a chest x-ray.    - promethazine-dextromethorphan (PROMETHAZINE-DM) 6.25-15 MG/5ML syrup; Take 5 mLs by mouth 4 (four) times daily as needed for cough.  Dispense: 118 mL; Refill: 0  3. Bacterial conjunctivitis Agree with antibiotic drop.  Patient to let us know if pharmacy does not have Cipro eye drops today and we can try other pharmacy or call in alternate treatment.   Follow up plan: Return if symptoms worsen or fail to improve.   Due to the catastrophic nature of the COVID-19 pandemic, this video visit was completed  soley via audio and visual contact via Careg[facility] due to the restrictions of the COVID-19 pandemic.  All issues as above were discussed and addressed. Physical exam was done as above through visual confirmation on Caregility. If it was felt that the patient should be evaluated in the office, they were directed there. The patient verbally consented to this visit. Location of the patient: home Location of the provider: work Those involved with this call:  Provider: Cathlean Marseilles, DNP, FNP-C CMA: n/a Front Desk/Registration: Percival Spanish  Time spent on call:  14 minutes with patient face to face via video conference. More than 50% of this time was spent in counseling and coordination of care. 15 minutes total spent in review of patient's record and preparation of their chart. I verified patient identity using two factors (patient name and date of birth). Patient consents verbally to being seen via telemedicine visit today.

## 2021-11-24 ENCOUNTER — Encounter: Payer: Self-pay | Admitting: Family Medicine

## 2021-11-24 DIAGNOSIS — R002 Palpitations: Secondary | ICD-10-CM

## 2021-11-24 MED ORDER — METOPROLOL SUCCINATE ER 25 MG PO TB24: 25.0000 mg | ORAL_TABLET | Freq: Every day | ORAL | 3 refills | Status: DC

## 2021-11-28 ENCOUNTER — Ambulatory Visit (INDEPENDENT_AMBULATORY_CARE_PROVIDER_SITE_OTHER): Payer: BC Managed Care – PPO | Admitting: Family Medicine

## 2021-11-28 VITALS — BP 122/74 | HR 76 | Temp 97.1°F | Ht 69.0 in | Wt 163.6 lb

## 2021-11-28 DIAGNOSIS — R002 Palpitations: Secondary | ICD-10-CM | POA: Diagnosis not present

## 2021-11-28 NOTE — Progress Notes (Signed)
? ?Subjective:  ? ? Patient ID: Diane Lee, female    DOB: 1981-06-04, 41 y.o.   MRN: [id number] ? ?HPI ?Patient is a very pleasant 41 year old Caucasian female mother of 2 who presents today complaining of palpitations in her chest.  She has no significant past medical history.  Friday, she was teaching.  For no reason, she felt her heart go out of rhythm.  It started racing.  She denies any chest pain or shortness of breath or lightheadedness or syncope.  However EMS was called.  Shortly before EMS arrived at the school, the heart spontaneously converted back to normal sinus rhythm.  However she estimates that her heart rate was approaching 180.  EMS took a rhythm strip on arrival.  The patient has a rhythm strip with her today.  Shows normal sinus rhythm.  She was slightly tachycardic with a heart rate just above 100.  Her QTc interval is normal.  Her PR interval showed no evidence of Wolff-Parkinson-White.  There was some nonspecific ST depressions but otherwise her EKG was normal.  We discussed the situation on the phone Friday and I felt that her symptoms were consistent with supraventricular tachycardia.  Therefore we decided to empirically start Toprol-XL 25 mg a day.  She states that she has been taking that over the weekend.  She denies any side effects from the medication.  Specifically she denies any lightheadedness or dizziness.  Her blood pressure today is excellent ?Heart rate is normal.  Thankfully she has not had any further episodes.  She states that her father has a history of SVT. ?Past Medical History:  ?Diagnosis Date  ? Abnormal Pap smear   ? Thrombocytopenic, cyclic ([place])   ? Was borderline, has been cleared by MD  ? ?Past Surgical History:  ?Procedure Laterality Date  ? CERVICAL BIOPSY    ? Benign  ? TONSILLECTOMY    ? VAGINAL DELIVERY    ? WISDOM TOOTH EXTRACTION    ? ?Current Outpatient Medications on File Prior to Visit  ?Medication Sig Dispense Refill  ? albuterol (VENTOLIN HFA) 108  (90 Base) MCG/ACT inhaler Inhale 2 puffs into the lungs every 4 (four) hours as needed for wheezing or shortness of breath. 6.7 g 3  ? benzonatate (TESSALON) 200 MG capsule Take 200 mg by mouth 3 (three) times daily as needed.    ? cetirizine (ZYRTEC) 10 MG tablet Take 10 mg by mouth daily.    ? cycloSPORINE (RESTASIS) 0.05 % ophthalmic emulsion 1 drop 2 (two) times daily.    ? LO LOESTRIN FE 1 MG-10 MCG / 10 MCG tablet Take 1 tablet by mouth daily.  4  ? metoprolol succinate (TOPROL-XL) 25 MG 24 hr tablet Take 1 tablet (25 mg total) by mouth daily. 90 tablet 3  ? Multiple Vitamin (MULTIVITAMIN) tablet Take 1 tablet by mouth daily.    ? promethazine-dextromethorphan (PROMETHAZINE-DM) 6.25-15 MG/5ML syrup Take 5 mLs by mouth 4 (four) times daily as needed for cough. 118 mL 0  ? ?No current facility-administered medications on file prior to visit.  ? ?Allergies  ?Allergen Reactions  ? Sulfa Antibiotics   ?  Other reaction(s): Other ?Joint pain   ? Bactrim Other (See Comments)  ?  Pt states that this med causes joint pain, and her white blood cells and platelets to drop.  ? ?Social History  ? ?Socioeconomic History  ? Marital status: Married  ?  Spouse name: Not on file  ? Number of children: Not on  file  ? Years of education: Not on file  ? Highest education level: Not on file  ?Occupational History  ? Occupation: Pharmacist, hospital  ?Tobacco Use  ? Smoking status: Never  ? Smokeless tobacco: Never  ?Substance and Sexual Activity  ? Alcohol use: No  ? Drug use: No  ? Sexual activity: Yes  ?Other Topics Concern  ? Not on file  ?Social History Narrative  ? Not on file  ? ?Social Determinants of Health  ? ?Financial Resource Strain: Not on file  ?Food Insecurity: Not on file  ?Transportation Needs: Not on file  ?Physical Activity: Not on file  ?Stress: Not on file  ?Social Connections: Not on file  ?Intimate Partner Violence: Not on file  ? ? ? ? ?Review of Systems  ?All other systems reviewed and are negative. ? ?   ?Objective:  ?  Physical Exam ?Constitutional:   ?   Appearance: Normal appearance. She is normal weight.  ?Cardiovascular:  ?   Rate and Rhythm: Normal rate and regular rhythm.  ?   Pulses: Normal pulses.  ?   Heart sounds: Normal heart sounds. No murmur heard. ?  No friction rub. No gallop.  ?Pulmonary:  ?   Effort: Pulmonary effort is normal. No respiratory distress.  ?   Breath sounds: Normal breath sounds. No stridor.  ?Musculoskeletal:  ?   Right lower leg: No edema.  ?   Left lower leg: No edema.  ?Neurological:  ?   Mental Status: She is alert.  ? ? ? ? ? ?   ?Assessment & Plan:  ?Palpitations - Plan: BASIC METABOLIC PANEL WITH GFR, TSH ?I suspect SVT.  We will continue Toprol-XL 25 mg daily.  The patient will try to avoid caffeine.  She does have a history of asthma we discussed that the metoprolol may exacerbate her asthma.  If so she can stop the medication.  Otherwise I will check a BMP to monitor for any electrolyte disturbances along with TSH to check for any hypothyroidism.  I will consult cardiology for an event monitor any other potential arrhythmias.  We discussed Valsalva maneuvers.  We also discussed when to go to the hospital and other options she has to treat SVT if recurrent. ? ?

## 2021-11-29 LAB — BASIC METABOLIC PANEL WITH GFR
BUN: 14 mg/dL (ref 7–25)
CO2: 28 mmol/L (ref 20–32)
Calcium: 9 mg/dL (ref 8.6–10.2)
Chloride: 104 mmol/L (ref 98–110)
Creat: 0.9 mg/dL (ref 0.50–0.99)
Glucose, Bld: 94 mg/dL (ref 65–99)
Potassium: 4.1 mmol/L (ref 3.5–5.3)
Sodium: 137 mmol/L (ref 135–146)
eGFR: 82 mL/min/{1.73_m2} (ref >=60)

## 2021-11-29 LAB — TSH: TSH: 1.08 mIU/L

## 2021-12-04 ENCOUNTER — Encounter: Payer: Self-pay | Admitting: Family Medicine

## 2021-12-11 ENCOUNTER — Encounter: Payer: Self-pay | Admitting: Internal Medicine

## 2021-12-11 ENCOUNTER — Ambulatory Visit (INDEPENDENT_AMBULATORY_CARE_PROVIDER_SITE_OTHER): Payer: BC Managed Care – PPO | Admitting: Internal Medicine

## 2021-12-11 VITALS — BP 105/60 | HR 80 | Ht 69.0 in | Wt 164.4 lb

## 2021-12-11 DIAGNOSIS — R002 Palpitations: Secondary | ICD-10-CM | POA: Diagnosis not present

## 2021-12-11 NOTE — Progress Notes (Signed)
? ?OFFICE CONSULT NOTE ? ?Chief Complaint:  ?Palpitations ? ?Primary Care Physician: ?Donita Brooks, MD ? ?HPI:  ?Diane Lee is a 41 y.o. female who is being seen today for the evaluation of palpitations at the request of Pickard, Priscille Heidelberg, MD. This is a pleasant 41 year old female kindly referred for evaluation management of palpitations.  She reports having some degree of palpitations on and off for most of her adult life, but more recently she has had some more persistent palpitations including one episode that lasted for quite a long time and led to her calling EMS.  Upon their arrival, she was not noted to have any abnormal rhythm however she did have persistent tachycardia.  She has the ability to monitor arrhythmias via Apple watch.  She saw her PCP who suspected she may be having an SVT and recommended starting her on Toprol-XL 25 mg daily.  She has been taking that as well as reducing her caffeine intake and notes marked improvement in her palpitations.  She has denied any chest pain or worsening shortness of breath.  She exercises fairly regularly and is active and denies any symptoms with exertion.  No significant family history of early onset heart disease. ? ?PMHx:  ?Past Medical History:  ?Diagnosis Date  ? Abnormal Pap smear   ? Thrombocytopenic, cyclic (HCC)   ? Was borderline, has been cleared by MD  ? ? ?Past Surgical History:  ?Procedure Laterality Date  ? CERVICAL BIOPSY    ? Benign  ? TONSILLECTOMY    ? VAGINAL DELIVERY    ? WISDOM TOOTH EXTRACTION    ? ? ?FAMHx:  ?Family History  ?Problem Relation Age of Onset  ? Breast cancer Maternal Grandmother   ? Prostate cancer Father   ? ? ?SOCHx:  ? reports that she has never smoked. She has never used smokeless tobacco. She reports that she does not drink alcohol and does not use drugs. ? ?ALLERGIES:  ?Allergies  ?Allergen Reactions  ? Sulfa Antibiotics   ?  Other reaction(s): Other ?Joint pain   ? Bactrim Other (See Comments)  ?  Pt states that  this med causes joint pain, and her white blood cells and platelets to drop.  ? ? ?ROS: ?Pertinent items noted in HPI and remainder of comprehensive ROS otherwise negative. ? ?HOME MEDS: ?Current Outpatient Medications on File Prior to Visit  ?Medication Sig Dispense Refill  ? albuterol (VENTOLIN HFA) 108 (90 Base) MCG/ACT inhaler Inhale 2 puffs into the lungs every 4 (four) hours as needed for wheezing or shortness of breath. 6.7 g 3  ? cetirizine (ZYRTEC) 10 MG tablet Take 10 mg by mouth daily.    ? cycloSPORINE (RESTASIS) 0.05 % ophthalmic emulsion 1 drop 2 (two) times daily.    ? LO LOESTRIN FE 1 MG-10 MCG / 10 MCG tablet Take 1 tablet by mouth daily.  4  ? metoprolol succinate (TOPROL-XL) 25 MG 24 hr tablet Take 1 tablet (25 mg total) by mouth daily. 90 tablet 3  ? Multiple Vitamin (MULTIVITAMIN) tablet Take 1 tablet by mouth daily.    ? ?No current facility-administered medications on file prior to visit.  ? ? ?LABS/IMAGING: ?No results found for this or any previous visit (from the past 48 hour(s)). ?No results found. ? ?LIPID PANEL: ?   ?Component Value Date/Time  ? CHOL 128 08/23/2014 0832  ? TRIG 64 08/23/2014 0832  ? HDL 41 08/23/2014 0832  ? CHOLHDL 3.1 08/23/2014 0832  ? VLDL  13 08/23/2014 0832  ? LDLCALC 74 08/23/2014 0832  ? ? ?WEIGHTS: ?Wt Readings from Last 3 Encounters:  ?12/11/21 164 lb 6.4 oz (74.6 kg)  ?11/28/21 163 lb 9.6 oz (74.2 kg)  ?07/16/18 149 lb (67.6 kg)  ? ? ?VITALS: ?BP 105/60   Pulse 80   Ht 5\' 9"  (1.753 m)   Wt 164 lb 6.4 oz (74.6 kg)   SpO2 97%   BMI 24.28 kg/m?  ? ?EXAM: ?General appearance: alert and no distress ?Neck: no carotid bruit, no JVD, and thyroid not enlarged, symmetric, no tenderness/mass/nodules ?Lungs: clear to auscultation bilaterally ?Heart: regular rate and rhythm, S1, S2 normal, no murmur, click, rub or gallop ?Abdomen: soft, non-tender; bowel sounds normal; no masses,  no organomegaly ?Extremities: extremities normal, atraumatic, no cyanosis or  edema ?Pulses: 2+ and symmetric ?Skin: Skin color, texture, turgor normal. No rashes or lesions ?Neurologic: Grossly normal ?Psych: Pleasant ? ?EKG: ?Normal sinus rhythm at 80, nonspecific T wave changes- personally reviewed ? ?ASSESSMENT: ?Palpitations ? ?PLAN: ?1.   Ms. Newlun has been describing some palpitations which may be an SVT, but is difficult to say.  There is no significant family history of early heart disease and she denies any concerning symptoms such as chest pain or worsening shortness of breath.  Exam does not indicate any significant cardiac findings.  She has had marked improvement in her symptoms on metoprolol.  I would advise continuing that since she is comfortable with it.  Blood pressure was low normal but she has not had any issues with symptomatic bradycardia or hypotension. ? ?Plan follow-up with me as needed.  Thanks again for the kind referral. ? ?Sedalia Muta, MD, [facility], FACP  ?[facility]  ?Medical Director of the Advanced Lipid Disorders &  ?Cardiovascular Risk Reduction Clinic ?Diplomate of the [facility] of Clinical Lipidology ?Attending Cardiologist  ?Direct Dial: [phone]  Fax: [phone]  ?Website:  [email or web address] ? ?[phone] Lateisha Thurlow ?12/11/2021, 8:46 AM ?

## 2021-12-11 NOTE — Patient Instructions (Signed)
Medication Instructions:  ?Your physician recommends that you continue on your current medications as directed. Please refer to the Current Medication list given to you today. ? ?*If you need a refill on your cardiac medications before your next appointment, please call your pharmacy* ? ? ?Follow-Up: ?At [facility], you and your health needs are our priority.  As part of our continuing mission to provide you with exceptional heart care, we have created designated Provider Care Teams.  These Care Teams include your primary Cardiologist (physician) and Advanced Practice Providers (APPs -  Physician Assistants and Nurse Practitioners) who all work together to provide you with the care you need, when you need it. ? ?We recommend signing up for the patient portal called "MyChart".  Sign up information is provided on this After Visit Summary.  MyChart is used to connect with patients for Virtual Visits (Telemedicine).  Patients are able to view lab/test results, encounter notes, upcoming appointments, etc.  Non-urgent messages can be sent to your provider as well.   ?To learn more about what you can do with MyChart, go to [email or web address].   ? ?Your next appointment:   ?AS NEEDED with Dr. Cyndie Chime  ? ?Important Information About Sugar ? ? ? ? ? ? ?

## 2022-08-14 ENCOUNTER — Telehealth: Payer: BC Managed Care – PPO | Admitting: Physician Assistant

## 2022-08-14 ENCOUNTER — Telehealth: Payer: Self-pay

## 2022-08-14 ENCOUNTER — Encounter: Payer: Self-pay | Admitting: Family Medicine

## 2022-08-14 ENCOUNTER — Other Ambulatory Visit: Payer: Self-pay | Admitting: Family Medicine

## 2022-08-14 DIAGNOSIS — R6889 Other general symptoms and signs: Secondary | ICD-10-CM

## 2022-08-14 MED ORDER — NIRMATRELVIR/RITONAVIR (PAXLOVID)TABLET: 3.0000 | ORAL_TABLET | Freq: Two times a day (BID) | ORAL | 0 refills | Status: AC

## 2022-08-14 NOTE — Progress Notes (Signed)
Patient out of state

## 2022-08-14 NOTE — Telephone Encounter (Signed)
Pt called in stating that she has tested positive for Covid. Pt is out of town visiting in [place]. Pt would like to know if pcp would send in a prescription for her. Please advise.  Cb#: [phone]

## 2022-11-16 ENCOUNTER — Other Ambulatory Visit: Payer: Self-pay | Admitting: Family Medicine

## 2022-11-16 NOTE — Telephone Encounter (Signed)
Unable to refill per protocol, Rx request is too soon. Last refill 11/24/21 for 90 days and 3 refills. Patient needs OV for additional refills.  Requested Prescriptions  Pending Prescriptions Disp Refills   metoprolol succinate (TOPROL-XL) 25 MG 24 hr tablet [Pharmacy Med Name: METOPROLOL SUCC ER 25 MG TAB] 90 tablet 3    Sig: TAKE 1 TABLET (25 MG TOTAL) BY MOUTH DAILY.     Cardiovascular:  Beta Blockers Failed - 11/16/2022  2:07 AM      Failed - Valid encounter within last 6 months    Recent Outpatient Visits           11 months ago Palpitations   [place] Dennard Schaumann, Cammie Mcgee, MD   1 year ago Mild intermittent asthma with exacerbation   [place] Eulogio Bear, NP   4 years ago Mild intermittent asthma with acute exacerbation   [place] Delsa Grana, PA-C   5 years ago Left sciatic nerve pain   [place] Orlena Sheldon, PA-C   5 years ago [place], Cammie Mcgee, MD              Passed - Last BP in normal range    BP Readings from Last 1 Encounters:  12/11/21 105/60         Passed - Last Heart Rate in normal range    Pulse Readings from Last 1 Encounters:  12/11/21 80

## 2022-11-24 ENCOUNTER — Other Ambulatory Visit: Payer: Self-pay | Admitting: Family Medicine

## 2022-11-26 NOTE — Telephone Encounter (Signed)
Requested medications are due for refill today.  yes  Requested medications are on the active medications list.  yes  Last refill. 11/24/2021 #90 3 rf  Future visit scheduled.   no  Notes to clinic.  Pt is more than 3 months overdue for OV.     Requested Prescriptions  Pending Prescriptions Disp Refills   metoprolol succinate (TOPROL-XL) 25 MG 24 hr tablet [Pharmacy Med Name: METOPROLOL SUCC ER 25 MG TAB] 90 tablet 3    Sig: Take 1 tablet (25 mg total) by mouth daily.     Cardiovascular:  Beta Blockers Failed - 11/24/2022  2:05 PM      Failed - Valid encounter within last 6 months    Recent Outpatient Visits           12 months ago Palpitations   [place] Dennard Schaumann, Cammie Mcgee, MD   1 year ago Mild intermittent asthma with exacerbation   [place] Eulogio Bear, NP   4 years ago Mild intermittent asthma with acute exacerbation   [place] Delsa Grana, PA-C   5 years ago Left sciatic nerve pain   [place] Orlena Sheldon, PA-C   5 years ago [place], Cammie Mcgee, MD              Passed - Last BP in normal range    BP Readings from Last 1 Encounters:  12/11/21 105/60         Passed - Last Heart Rate in normal range    Pulse Readings from Last 1 Encounters:  12/11/21 80

## 2022-12-21 ENCOUNTER — Other Ambulatory Visit: Payer: Self-pay | Admitting: Family Medicine

## 2022-12-21 NOTE — Telephone Encounter (Signed)
Requested medication (s) are due for refill today: No  Requested medication (s) are on the active medication list: Yes  Last refill:  11/26/22  Future visit scheduled: No  Notes to clinic:  Pharmacy requests 90 day supply.    Requested Prescriptions  Pending Prescriptions Disp Refills   metoprolol succinate (TOPROL-XL) 25 MG 24 hr tablet [Pharmacy Med Name: METOPROLOL SUCC ER 25 MG TAB] 90 tablet 1    Sig: TAKE 1 TABLET (25 MG TOTAL) BY MOUTH DAILY.     Cardiovascular:  Beta Blockers Failed - 12/21/2022  1:30 PM      Failed - Valid encounter within last 6 months    Recent Outpatient Visits           1 year ago Palpitations   [facility] Tanya Nones, Priscille Heidelberg, MD   1 year ago Mild intermittent asthma with exacerbation   [facility] Valentino Nose, NP   4 years ago Mild intermittent asthma with acute exacerbation   [facility] Danelle Berry, PA-C   5 years ago Left sciatic nerve pain   [facility] Dorena Bodo, PA-C   5 years ago Pleurisy   [facility] Pickard, Priscille Heidelberg, MD              Passed - Last BP in normal range    BP Readings from Last 1 Encounters:  12/11/21 105/60         Passed - Last Heart Rate in normal range    Pulse Readings from Last 1 Encounters:  12/11/21 80

## 2022-12-24 ENCOUNTER — Other Ambulatory Visit: Payer: Self-pay | Admitting: Family Medicine

## 2022-12-25 NOTE — Telephone Encounter (Signed)
Requested medication (s) are due for refill today: yes  Requested medication (s) are on the active medication list: yes  Last refill:  11/26/22  Future visit scheduled: no  Notes to clinic:  Unable to refill per protocol, courtesy refill already given, routing for provider approval.      Requested Prescriptions  Pending Prescriptions Disp Refills   metoprolol succinate (TOPROL-XL) 25 MG 24 hr tablet [Pharmacy Med Name: METOPROLOL SUCC ER 25 MG TAB] 30 tablet 0    Sig: TAKE 1 TABLET (25 MG TOTAL) BY MOUTH DAILY.     Cardiovascular:  Beta Blockers Failed - 12/24/2022 11:41 AM      Failed - Valid encounter within last 6 months    Recent Outpatient Visits           1 year ago Palpitations   [facility] Tanya Nones, Priscille Heidelberg, MD   1 year ago Mild intermittent asthma with exacerbation   [facility] Valentino Nose, NP   4 years ago Mild intermittent asthma with acute exacerbation   [facility] Danelle Berry, PA-C   5 years ago Left sciatic nerve pain   [facility] Dorena Bodo, PA-C   5 years ago Pleurisy   [facility] Pickard, Priscille Heidelberg, MD              Passed - Last BP in normal range    BP Readings from Last 1 Encounters:  12/11/21 105/60         Passed - Last Heart Rate in normal range    Pulse Readings from Last 1 Encounters:  12/11/21 80

## 2023-01-20 ENCOUNTER — Other Ambulatory Visit: Payer: Self-pay | Admitting: Family Medicine

## 2023-01-22 ENCOUNTER — Other Ambulatory Visit: Payer: Self-pay | Admitting: Family Medicine

## 2023-01-22 NOTE — Telephone Encounter (Signed)
Requested medications are due for refill today.  yes  Requested medications are on the active medications list.  yes  Last refill. 12/25/2022 #30 0 rf  Future visit scheduled.   no  Notes to clinic.  Pt is more than 3 months overdue for an OV.     Requested Prescriptions  Pending Prescriptions Disp Refills   metoprolol succinate (TOPROL-XL) 25 MG 24 hr tablet [Pharmacy Med Name: METOPROLOL SUCC ER 25 MG TAB] 90 tablet 1    Sig: TAKE 1 TABLET (25 MG TOTAL) BY MOUTH DAILY.     Cardiovascular:  Beta Blockers Failed - 01/20/2023  8:31 AM      Failed - Valid encounter within last 6 months    Recent Outpatient Visits           1 year ago Palpitations   [facility] Tanya Nones, Priscille Heidelberg, MD   1 year ago Mild intermittent asthma with exacerbation   [facility] Valentino Nose, NP   4 years ago Mild intermittent asthma with acute exacerbation   [facility] Danelle Berry, PA-C   5 years ago Left sciatic nerve pain   [facility] Dorena Bodo, PA-C   5 years ago Pleurisy   [facility] Pickard, Priscille Heidelberg, MD              Passed - Last BP in normal range    BP Readings from Last 1 Encounters:  12/11/21 105/60         Passed - Last Heart Rate in normal range    Pulse Readings from Last 1 Encounters:  12/11/21 80

## 2023-01-23 NOTE — Telephone Encounter (Signed)
OV needed for 90 day supply.  Requested Prescriptions  Pending Prescriptions Disp Refills   metoprolol succinate (TOPROL-XL) 25 MG 24 hr tablet [Pharmacy Med Name: METOPROLOL SUCC ER 25 MG TAB] 30 tablet 0    Sig: TAKE 1 TABLET (25 MG TOTAL) BY MOUTH DAILY.     Cardiovascular:  Beta Blockers Failed - 01/22/2023  5:48 PM      Failed - Valid encounter within last 6 months    Recent Outpatient Visits           1 year ago Palpitations   [facility] Tanya Nones, Priscille Heidelberg, MD   1 year ago Mild intermittent asthma with exacerbation   [facility] Valentino Nose, NP   4 years ago Mild intermittent asthma with acute exacerbation   [facility] Danelle Berry, PA-C   5 years ago Left sciatic nerve pain   [facility] Dorena Bodo, PA-C   5 years ago Pleurisy   [facility] Pickard, Priscille Heidelberg, MD              Passed - Last BP in normal range    BP Readings from Last 1 Encounters:  12/11/21 105/60         Passed - Last Heart Rate in normal range    Pulse Readings from Last 1 Encounters:  12/11/21 80

## 2023-01-25 ENCOUNTER — Telehealth: Payer: Self-pay

## 2023-01-25 ENCOUNTER — Other Ambulatory Visit: Payer: Self-pay

## 2023-01-25 DIAGNOSIS — R002 Palpitations: Secondary | ICD-10-CM

## 2023-01-25 MED ORDER — METOPROLOL SUCCINATE ER 25 MG PO TB24: 25.0000 mg | ORAL_TABLET | Freq: Every day | ORAL | 1 refills | Status: DC

## 2023-01-25 NOTE — Telephone Encounter (Signed)
Pt called in to request another courtesy refill of this med metoprolol succinate (TOPROL-XL) 25 MG 24 hr tablet [[id number]]. Pt states that she has 2 pills left of this med. Pt has scheduled a medcheck/follow up appt with pcp for 02/05/23. Please advise.  LOV: 11/28/21 UPCOMING APPT 02/05/23  PHARMACY: [id number] - [place], [place] - [address] AT [facility] [address], [place] [address] Phone: [phone]  Fax: [phone]    CB#: [phone]

## 2023-02-05 ENCOUNTER — Ambulatory Visit (INDEPENDENT_AMBULATORY_CARE_PROVIDER_SITE_OTHER): Payer: BC Managed Care – PPO | Admitting: Family Medicine

## 2023-02-05 DIAGNOSIS — R002 Palpitations: Secondary | ICD-10-CM

## 2023-02-05 MED ORDER — METOPROLOL SUCCINATE ER 25 MG PO TB24: 25.0000 mg | ORAL_TABLET | Freq: Every day | ORAL | 3 refills | Status: DC

## 2023-02-05 NOTE — Progress Notes (Signed)
Subjective:    Patient ID: Diane Lee, female    DOB: 12/30/1980, 42 y.o.   MRN: [id number]  Eye Problem   Patient is a very pleasant 42 year old Caucasian female who is currently taking Toprol-XL 25 mg daily for palpitations.  Please see my office visit from last year.  At that time her history suggested that she had an episode of SVT that spontaneously stopped.  She has not had any further episodes since starting the medication.  She never had an event monitor but she denies any symptoms, palpitations, or irregular heartbeats.  She denies any syncope or lightheadedness or chest pain.  She recently saw her gynecologist who checked a CBC, CMP, TSH, lipid panel, and a vitamin D level.  All of her values were normal except for white blood cell count 3.6, alkaline phosphatase of 42.  The remainder of her labs were excellent.  Her HDL cholesterol slightly low at 47 Past Medical History:  Diagnosis Date   Abnormal Pap smear    Thrombocytopenic, cyclic (HCC)    Was borderline, has been cleared by MD   Past Surgical History:  Procedure Laterality Date   CERVICAL BIOPSY     Benign   TONSILLECTOMY     VAGINAL DELIVERY     WISDOM TOOTH EXTRACTION     Current Outpatient Medications on File Prior to Visit  Medication Sig Dispense Refill   albuterol (VENTOLIN HFA) 108 (90 Base) MCG/ACT inhaler Inhale 2 puffs into the lungs every 4 (four) hours as needed for wheezing or shortness of breath. 6.7 g 3   cetirizine (ZYRTEC) 10 MG tablet Take 10 mg by mouth daily.     cycloSPORINE (RESTASIS) 0.05 % ophthalmic emulsion 1 drop 2 (two) times daily.     LO LOESTRIN FE 1 MG-10 MCG / 10 MCG tablet Take 1 tablet by mouth daily.  4   Multiple Vitamin (MULTIVITAMIN) tablet Take 1 tablet by mouth daily.     No current facility-administered medications on file prior to visit.   Allergies  Allergen Reactions   Sulfa Antibiotics     Other reaction(s): Other Joint pain    Bactrim Other (See Comments)    Pt  states that this med causes joint pain, and her white blood cells and platelets to drop.   Social History   Socioeconomic History   Marital status: Married    Spouse name: Not on file   Number of children: Not on file   Years of education: Not on file   Highest education level: Master's degree (e.g., MA, MS, MEng, MEd, MSW, MBA)  Occupational History   Occupation: Runner, broadcasting/film/video  Tobacco Use   Smoking status: Never   Smokeless tobacco: Never  Substance and Sexual Activity   Alcohol use: No   Drug use: No   Sexual activity: Yes  Other Topics Concern   Not on file  Social History Narrative   Not on file   Social Determinants of Health   Financial Resource Strain: Low Risk  (02/05/2023)   Overall Financial Resource Strain (CARDIA)    Difficulty of Paying Living Expenses: Not hard at all  Food Insecurity: No Food Insecurity (02/05/2023)   Hunger Vital Sign    Worried About Running Out of Food in the Last Year: Never true    Ran Out of Food in the Last Year: Never true  Transportation Needs: No Transportation Needs (02/05/2023)   PRAPARE - Administrator, Civil Service (Medical): No  Lack of Transportation (Non-Medical): No  Physical Activity: Insufficiently Active (02/05/2023)   Exercise Vital Sign    Days of Exercise per Week: 2 days    Minutes of Exercise per Session: 20 min  Stress: No Stress Concern Present (02/05/2023)   [employer] of Occupational Health - Occupational Stress Questionnaire    Feeling of Stress : Not at all  Social Connections: Socially Integrated (02/05/2023)   Social Connection and Isolation Panel [NHANES]    Frequency of Communication with Friends and Family: More than three times a week    Frequency of Social Gatherings with Friends and Family: Twice a week    Attends Religious Services: More than 4 times per year    Active Member of [employer] or Organizations: Yes    Attends Engineer, structural: More than 4 times per year     Marital Status: Married  Catering manager Violence: Not on file      Review of Systems  All other systems reviewed and are negative.      Objective:   Physical Exam Constitutional:      Appearance: Normal appearance. She is normal weight.  Cardiovascular:     Rate and Rhythm: Normal rate and regular rhythm.     Pulses: Normal pulses.     Heart sounds: Normal heart sounds. No murmur heard.    No friction rub. No gallop.  Pulmonary:     Effort: Pulmonary effort is normal. No respiratory distress.     Breath sounds: Normal breath sounds. No stridor.  Musculoskeletal:     Right lower leg: No edema.     Left lower leg: No edema.  Neurological:     Mental Status: She is alert.           Assessment & Plan:  Palpitations - Plan: metoprolol succinate (TOPROL-XL) 25 MG 24 hr tablet I suspect SVT.  We will continue Toprol-XL 25 mg daily.  I recommended that the patient try stopping patient to see if she continues to needed.  If she experiences palpitations or any irregular heartbeats off medication she can certainly resume them.  She is also been having intermittent red irritated eyes that come and go.  She been diagnosed with chronic dry eye and is currently taking Xidra.  I recommended adding preservative-free artificial tears to see if this will help with some of the irritation.

## 2023-10-01 ENCOUNTER — Other Ambulatory Visit: Payer: Self-pay | Admitting: Family Medicine

## 2023-10-01 ENCOUNTER — Encounter: Payer: Self-pay | Admitting: Family Medicine

## 2023-10-01 MED ORDER — OSELTAMIVIR PHOSPHATE 75 MG PO CAPS: 75.0000 mg | ORAL_CAPSULE | Freq: Two times a day (BID) | ORAL | 0 refills | Status: DC

## 2023-10-17 ENCOUNTER — Encounter: Payer: Self-pay | Admitting: Family Medicine

## 2023-10-24 ENCOUNTER — Emergency Department (HOSPITAL_COMMUNITY): Payer: 59

## 2023-10-24 ENCOUNTER — Encounter (HOSPITAL_COMMUNITY): Payer: Self-pay

## 2023-10-24 ENCOUNTER — Other Ambulatory Visit: Payer: Self-pay

## 2023-10-24 ENCOUNTER — Ambulatory Visit: Payer: BC Managed Care – PPO | Admitting: Family Medicine

## 2023-10-24 ENCOUNTER — Emergency Department (HOSPITAL_COMMUNITY)
Admission: EM | Admit: 2023-10-24 | Discharge: 2023-10-24 | Disposition: A | Discharge status: Home / Self Care | Payer: 59 | Attending: Emergency Medicine | Admitting: Emergency Medicine

## 2023-10-24 DIAGNOSIS — E876 Hypokalemia: Secondary | ICD-10-CM | POA: Insufficient documentation

## 2023-10-24 DIAGNOSIS — K209 Esophagitis, unspecified without bleeding: Secondary | ICD-10-CM | POA: Insufficient documentation

## 2023-10-24 DIAGNOSIS — R0981 Nasal congestion: Secondary | ICD-10-CM | POA: Diagnosis present

## 2023-10-24 DIAGNOSIS — R911 Solitary pulmonary nodule: Secondary | ICD-10-CM | POA: Insufficient documentation

## 2023-10-24 DIAGNOSIS — J189 Pneumonia, unspecified organism: Secondary | ICD-10-CM | POA: Diagnosis not present

## 2023-10-24 LAB — COMPREHENSIVE METABOLIC PANEL
ALT: 27 U/L (ref 0–44)
AST: 23 U/L (ref 15–41)
Albumin: 3.7 g/dL (ref 3.5–5.0)
Alkaline Phosphatase: 51 U/L (ref 38–126)
Anion gap: 15 (ref 5–15)
BUN: 10 mg/dL (ref 6–20)
CO2: 18 mmol/L — ABNORMAL LOW (ref 22–32)
Calcium: 9.5 mg/dL (ref 8.9–10.3)
Chloride: 100 mmol/L (ref 98–111)
Creatinine, Ser: 0.92 mg/dL (ref 0.44–1.00)
GFR, Estimated: >60 mL/min (ref >60)
Glucose, Bld: 134 mg/dL — ABNORMAL HIGH (ref 70–99)
Potassium: 3.3 mmol/L — ABNORMAL LOW (ref 3.5–5.1)
Sodium: 133 mmol/L — ABNORMAL LOW (ref 135–145)
Total Bilirubin: 0.6 mg/dL (ref 0.0–1.2)
Total Protein: 9 g/dL — ABNORMAL HIGH (ref 6.5–8.1)

## 2023-10-24 LAB — CBC
HCT: 38.6 % (ref 36.0–46.0)
Hemoglobin: 13.5 g/dL (ref 12.0–15.0)
MCH: 29.5 pg (ref 26.0–34.0)
MCHC: 35 g/dL (ref 30.0–36.0)
MCV: 84.5 fL (ref 80.0–100.0)
Platelets: 230 10*3/uL (ref 150–400)
RBC: 4.57 MIL/uL (ref 3.87–5.11)
RDW: 12 % (ref 11.5–15.5)
WBC: 10.6 10*3/uL — ABNORMAL HIGH (ref 4.0–10.5)
nRBC: 0 % (ref 0.0–0.2)

## 2023-10-24 LAB — PROTIME-INR
INR: 1 (ref 0.8–1.2)
Prothrombin Time: 13.3 s (ref 11.4–15.2)

## 2023-10-24 LAB — RESP PANEL BY RT-PCR (RSV, FLU A&B, COVID)  RVPGX2
Influenza A by PCR: NEGATIVE
Influenza B by PCR: NEGATIVE
Resp Syncytial Virus by PCR: NEGATIVE
SARS Coronavirus 2 by RT PCR: NEGATIVE

## 2023-10-24 LAB — HCG, SERUM, QUALITATIVE: Preg, Serum: NEGATIVE

## 2023-10-24 LAB — TSH: TSH: 2.082 u[IU]/mL (ref 0.350–4.500)

## 2023-10-24 MED ORDER — KETOROLAC TROMETHAMINE 15 MG/ML IJ SOLN
15.0000 mg | Freq: Once | INTRAMUSCULAR | Status: AC
  Administered 2023-10-24: 15 mg via INTRAVENOUS
  Filled 2023-10-24: qty 1

## 2023-10-24 MED ORDER — METOPROLOL SUCCINATE ER 25 MG PO TB24
25.0000 mg | ORAL_TABLET | Freq: Every day | ORAL | Status: DC
  Filled 2023-10-24: qty 1

## 2023-10-24 MED ORDER — AZITHROMYCIN 250 MG PO TABS: 250.0000 mg | ORAL_TABLET | Freq: Every day | ORAL | 0 refills | Status: AC

## 2023-10-24 MED ORDER — LIDOCAINE VISCOUS HCL 2 % MT SOLN
15.0000 mL | Freq: Once | OROMUCOSAL | Status: AC
  Administered 2023-10-24: 15 mL via OROMUCOSAL
  Filled 2023-10-24: qty 15

## 2023-10-24 MED ORDER — FAMOTIDINE 20 MG PO TABS: 20.0000 mg | ORAL_TABLET | Freq: Two times a day (BID) | ORAL | 0 refills | Status: DC

## 2023-10-24 MED ORDER — SODIUM CHLORIDE 0.9 % IV SOLN
500.0000 mg | Freq: Once | INTRAVENOUS | Status: AC
  Administered 2023-10-24: 500 mg via INTRAVENOUS
  Filled 2023-10-24: qty 5

## 2023-10-24 MED ORDER — SODIUM CHLORIDE 0.9 % IV SOLN
1.0000 g | Freq: Once | INTRAVENOUS | Status: AC
  Administered 2023-10-24: 1 g via INTRAVENOUS
  Filled 2023-10-24: qty 10

## 2023-10-24 MED ORDER — SODIUM CHLORIDE 0.9 % IV BOLUS
1000.0000 mL | Freq: Once | INTRAVENOUS | Status: AC
  Administered 2023-10-24: 1000 mL via INTRAVENOUS

## 2023-10-24 MED ORDER — LIDOCAINE VISCOUS HCL 2 % MT SOLN: 15.0000 mL | Freq: Three times a day (TID) | OROMUCOSAL | 0 refills | Status: AC | PRN

## 2023-10-24 MED ORDER — IOHEXOL 350 MG/ML SOLN
50.0000 mL | Freq: Once | INTRAVENOUS | Status: AC | PRN
  Administered 2023-10-24: 50 mL via INTRAVENOUS

## 2023-10-24 MED ORDER — KETOROLAC TROMETHAMINE 30 MG/ML IJ SOLN
30.0000 mg | Freq: Once | INTRAMUSCULAR | Status: AC
  Administered 2023-10-24: 30 mg via INTRAVENOUS
  Filled 2023-10-24: qty 1

## 2023-10-24 NOTE — ED Triage Notes (Signed)
 Patient reports she has had a chest cold for 2 weeks but over the past couple days she had had a cough and feels like something is in her throat and it wakes her up at night.  Patient has hx of tachycardia and on meds but upon arrival to triage patient HR 150-160s ST.  Patient denies fever.  Reports sick exposure 3 weeks ago but no other symptoms.

## 2023-10-24 NOTE — ED Provider Notes (Signed)
 [place] EMERGENCY DEPARTMENT AT [facility] Provider Note   CSN: [id number] Arrival date & time: 10/24/23  8657     History  Chief Complaint  Patient presents with   Tachycardia    Diane Lee is a 43 y.o. female with history of suspected SVT on Toprol-XL 25 mg daily presented to the ED with concern for congestion and shortness of breath and odynophagia.  Patient reports that she had a viral type URI symptom or a "chest cold" about 2 weeks ago.  The week prior to that her son had influenza.  She reports she has been coughing persistently for 2 weeks.  She said her symptoms abruptly worsened yesterday when she took a lot of cough drops at a musical show to suppress her cough.  She feels that her heart is racing and she is short of breath.  Reports she felt that she had a lump in her upper throat that is since moved down towards her lower throat and is painful specifically with swallowing.  She denies chest pain or pressure.  Denies history of coronary disease, smoking.  Denies any unilateral leg swelling or calf pain, prolonged immobilization, long flight times, history of DVT or PE, or estrogen use.  She did not take her morning metoprolol yet.  She does report that she coughed up a nickel sized blood clot yesterday.  HPI     Home Medications Prior to Admission medications   Medication Sig Start Date End Date Taking? Authorizing Provider  acetaminophen (TYLENOL) 500 MG tablet Take 1,000 mg by mouth every 6 (six) hours as needed for mild pain (pain score 1-3) or moderate pain (pain score 4-6).   Yes [provider]  albuterol (VENTOLIN HFA) 108 (90 Base) MCG/ACT inhaler Inhale 2 puffs into the lungs every 4 (four) hours as needed for wheezing or shortness of breath. 07/26/21  Yes Valentino Nose, NP  azithromycin (ZITHROMAX) 250 MG tablet Take 1 tablet (250 mg total) by mouth daily for 4 days. Take first 2 tablets together, then 1 every day until finished.  10/25/23 10/29/23 Yes Anda Sobotta, Kermit Balo, MD  cetirizine (ZYRTEC) 10 MG tablet Take 10 mg by mouth at bedtime.   Yes [provider]  cycloSPORINE (RESTASIS) 0.05 % ophthalmic emulsion Place 1 drop into both eyes 2 (two) times daily.   Yes [provider]  famotidine (PEPCID) 20 MG tablet Take 1 tablet (20 mg total) by mouth 2 (two) times daily. 10/24/23 11/23/23 Yes Makesha Belitz, Kermit Balo, MD  fluticasone (FLONASE) 50 MCG/ACT nasal spray Place 1 spray into both nostrils at bedtime.   Yes [provider]  ibuprofen (ADVIL) 200 MG tablet Take 600 mg by mouth every 6 (six) hours as needed for mild pain (pain score 1-3) or moderate pain (pain score 4-6).   Yes [provider]  lidocaine (XYLOCAINE) 2 % solution Use as directed 15 mLs in the mouth or throat every 8 (eight) hours as needed for up to 5 days for mouth pain. 10/24/23 10/29/23 Yes Nekesha Font, Kermit Balo, MD  LO LOESTRIN FE 1 MG-10 MCG / 10 MCG tablet Take 1 tablet by mouth at bedtime. 07/19/15  Yes [provider]  metoprolol succinate (TOPROL-XL) 25 MG 24 hr tablet Take 1 tablet (25 mg total) by mouth daily. 02/05/23  Yes Donita Brooks, MD  Multiple Vitamin (MULTIVITAMIN) tablet Take 2 tablets by mouth daily.   Yes [provider]  pseudoephedrine-guaifenesin (MUCINEX D) 60-600 MG 12 hr tablet  Take 2 tablets by mouth daily as needed for congestion.   Yes [provider]  oseltamivir (TAMIFLU) 75 MG capsule Take 1 capsule (75 mg total) by mouth 2 (two) times daily. Patient not taking: Reported on 10/24/2023 10/01/23   Donita Brooks, MD      Allergies    Sulfa antibiotics and Bactrim    Review of Systems   Review of Systems  Physical Exam Updated Vital Signs BP 121/72   Pulse 97   Temp (!) 97.4 F (36.3 C) (Oral)   Resp 20   Ht 5\' 9"  (1.753 m)   Wt 79.4 kg   SpO2 97%   BMI 25.84 kg/m  Physical Exam Constitutional:      General: She is not in acute distress. HENT:     Head:  Normocephalic and atraumatic.  Eyes:     Conjunctiva/sclera: Conjunctivae normal.     Pupils: Pupils are equal, round, and reactive to light.  Cardiovascular:     Rate and Rhythm: Regular rhythm. Tachycardia present.  Pulmonary:     Effort: Pulmonary effort is normal. No respiratory distress.     Comments: Rhonchi in the bilateral lower lobes, no acute respiratory distress, respiratory rate within normal limits, no expiratory wheezing. Abdominal:     General: There is no distension.     Tenderness: There is no abdominal tenderness.  Musculoskeletal:        General: No swelling.  Skin:    General: Skin is warm and dry.  Neurological:     General: No focal deficit present.     Mental Status: She is alert. Mental status is at baseline.  Psychiatric:        Mood and Affect: Mood normal.        Behavior: Behavior normal.     ED Results / Procedures / Treatments   Labs (all labs ordered are listed, but only abnormal results are displayed) Labs Reviewed  CBC - Abnormal; Notable for the following components:      Result Value   WBC 10.6 (*)    All other components within normal limits  COMPREHENSIVE METABOLIC PANEL - Abnormal; Notable for the following components:   Sodium 133 (*)    Potassium 3.3 (*)    CO2 18 (*)    Glucose, Bld 134 (*)    Total Protein 9.0 (*)    All other components within normal limits  RESP PANEL BY RT-PCR (RSV, FLU A&B, COVID)  RVPGX2  PROTIME-INR  TSH  HCG, SERUM, QUALITATIVE    EKG EKG Interpretation Date/Time:  Thursday October 24 2023 07:55:20 EST Ventricular Rate:  97 PR Interval:  140 QRS Duration:  76 QT Interval:  330 QTC Calculation: 420 R Axis:   75  Text Interpretation: Sinus rhythm Low voltage, precordial leads Borderline repolarization abnormality Confirmed by Alvester Chou [phone]) on 10/24/2023 7:56:59 AM  Radiology CT Angio Chest PE W and/or Wo Contrast Result Date: 10/24/2023 CLINICAL DATA:  Evaluate for pulmonary embolus.  EXAM: CT ANGIOGRAPHY CHEST WITH CONTRAST TECHNIQUE: Multidetector CT imaging of the chest was performed using the standard protocol during bolus administration of intravenous contrast. Multiplanar CT image reconstructions and MIPs were obtained to evaluate the vascular anatomy. RADIATION DOSE REDUCTION: This exam was performed according to the departmental dose-optimization program which includes automated exposure control, adjustment of the mA and/or kV according to patient size and/or use of iterative reconstruction technique. CONTRAST:  50mL OMNIPAQUE IOHEXOL 350 MG/ML SOLN COMPARISON:  Chest radiograph earlier same day  FINDINGS: Cardiovascular: Normal heart size. No pericardial effusion. Aorta and main pulmonary artery are normal in caliber. Adequate opacification of the pulmonary arterial system. No intraluminal filling defects identified to suggest acute pulmonary embolus. Mediastinum/Nodes: No enlarged axillary, mediastinal or hilar lymphadenopathy. Mild circumferential wall thickening of the esophagus. Lungs/Pleura: Central airways are patent. Large area of consolidation within the peripheral lingula (image 104; series 6). There is a 3 mm left lower lobe pulmonary nodule (image 60; series 6). There is a 2 mm lingular nodule (image 60; series 6). There is a 2 mm left upper lobe nodule (image 31; series 6). There is a 3 mm right upper lobe nodule (image 34; series 6). 2 mm right middle lobe nodule (image 89; series 6). 5 mm right middle lobe nodule (image 114; series 6). No pleural effusion or pneumothorax. Upper Abdomen: No acute process. Musculoskeletal: No aggressive or acute appearing osseous lesions. Review of the MIP images confirms the above findings. IMPRESSION: 1. No evidence for acute pulmonary embolus. 2. Large area of consolidation within the peripheral lingula concerning for pneumonia. Recommend continued radiographic follow-up to ensure resolution. 3. Multiple pulmonary nodules. Most significant:  Right solid pulmonary nodule within the upper lobe measuring 3 mm. Per Fleischner Society Guidelines, if patient is low risk for malignancy, no routine follow-up imaging is recommended. If patient is high risk for malignancy, a non-contrast Chest CT at 12 months is optional. If performed and the nodule is stable at 12 months, no further follow-up is recommended. These guidelines do not apply to immunocompromised patients and patients with cancer. Follow up in patients with significant comorbidities as clinically warranted. For lung cancer screening, adhere to Lung-RADS guidelines. Reference: Radiology. 2017; 284(1):228-43. 4. Mild circumferential wall thickening of the esophagus. Correlate for esophagitis. Electronically Signed   By: Annia Belt M.D.   On: 10/24/2023 09:56   DG Chest Port 1 View Result Date: 10/24/2023 CLINICAL DATA:  43 year old female with chest pain and tachycardia. EXAM: PORTABLE CHEST 1 VIEW COMPARISON:  Chest radiographs 10-18. FINDINGS: Portable AP semi upright view at 0710 hours. Lung volumes and mediastinal contours remain normal. Visualized tracheal air column is within normal limits. Allowing for portable technique the lungs are clear. No pneumothorax or pleural effusion. Negative visible bowel gas and osseous structures. IMPRESSION: Negative portable chest. Electronically Signed   By: Odessa Fleming M.D.   On: 10/24/2023 07:18    Procedures Procedures    Medications Ordered in ED Medications  azithromycin (ZITHROMAX) 500 mg in sodium chloride 0.9 % 250 mL IVPB (500 mg Intravenous New Bag/Given 10/24/23 1035)  sodium chloride 0.9 % bolus 1,000 mL (0 mLs Intravenous Stopped 10/24/23 0843)  ketorolac (TORADOL) 15 MG/ML injection 15 mg (15 mg Intravenous Given 10/24/23 0721)  iohexol (OMNIPAQUE) 350 MG/ML injection 50 mL (50 mLs Intravenous Contrast Given 10/24/23 0831)  cefTRIAXone (ROCEPHIN) 1 g in sodium chloride 0.9 % 100 mL IVPB (0 g Intravenous Stopped 10/24/23 1052)  lidocaine  (XYLOCAINE) 2 % viscous mouth solution 15 mL (15 mLs Mouth/Throat Given 10/24/23 1053)    ED Course/ Medical Decision Making/ A&P Clinical Course as of 10/24/23 1101  Thu Oct 24, 2023  0743 HR 105-110 bpm on telemetry in sinus tachycardia [MT]  1003 CT consistent with pneumonia and esophagitis, multiple pulmonary nodules but the patient is low risk. [MT]    Clinical Course User Index [MT] Takai Chiaramonte, Kermit Balo, MD  Medical Decision Making Amount and/or Complexity of Data Reviewed Radiology: ordered. ECG/medicine tests: ordered.  Risk Prescription drug management.   This patient presents to the ED with concern for shortness of breath, sore throat, productive cough. This involves an extensive number of treatment options, and is a complaint that carries with it a high risk of complications and morbidity.  The differential diagnosis includes bronchitis versus viral URI versus bacterial pneumonia or airway infection versus pulmonary embolism versus other  Additional history obtained from patient's family members at bedside  External records from outside source obtained and reviewed including cardiology evaluation in the office from April 2023 with Dr.Hilty noting that the patient may have a history of potential SVT but difficult to say, and continuing the patient on metoprolol for symptomatic improvement of her palpitations.  Heart rate was noted to be 80 in the office during that visit.  I ordered and personally interpreted labs.  The pertinent results include: No emergent findings.  White blood cell count 10.6.  Very mild hypokalemia potassium 3.3.  COVID and flu are negative.  I ordered imaging studies including x-ray of the chest, CT PE I independently visualized and interpreted imaging which showed multiple incidental pulmonary nodules, lingula pneumonia, possible esophagitis or esophageal inflammation I agree with the radiologist interpretation  The  patient was maintained on a cardiac monitor.  I personally viewed and interpreted the cardiac monitored which showed an underlying rhythm of: Sinus tachycardia and sinus rhythm  Per my interpretation the patient's ECG shows sinus tachycardia with suspected rate related ST changes  I ordered medication including viscous lidocaine for esophagitis, IV azithromycin and Rocephin for community pneumonia; Toradol for pain  I have reviewed the patients home medicines and have made adjustments as needed  Test Considered: Doubt aortic dissection, doubt stroke  After the interventions noted above, I reevaluated the patient and found that they have: improved  The patient is low risk regarding pulmonary nodules.  No smoking or malignancy history.  Per clinical recommendation she does not need any specific follow-up for this issue.  She is not immunocompromise and she does not have other risk factors for HIV, thrush, candidal esophagitis, in terms of her esophageal irritation.  This may be due to reflux or to her overconsumption of cough pills.  I do not believe she needs any specific treatment for this other than symptomatic control for several days and she can follow-up with her doctor for this painful swallowing issue.  Her heart rate did improve here in the ED under 100 bpm.  Her oxygen remained stable on room air.  She did not require hospitalization; low CURB65 score -reasonable to treat as an outpatient for community pneumonia.   Dispostion:  After consideration of the diagnostic results and the patients response to treatment, I feel that the patent would benefit from close outpatient follow-up.         Final Clinical Impression(s) / ED Diagnoses Final diagnoses:  Esophagitis  Pneumonia due to infectious organism, unspecified laterality, unspecified part of lung  Pulmonary nodule  Hypokalemia    Rx / DC Orders ED Discharge Orders          Ordered    azithromycin (ZITHROMAX) 250 MG  tablet  Daily       Note to Pharmacy: Initial 500 mg dose given in ER   10/24/23 1058    lidocaine (XYLOCAINE) 2 % solution  Every 8 hours PRN        10/24/23 1058    famotidine (  PEPCID) 20 MG tablet  2 times daily        10/24/23 1058              Terald Sleeper, MD 10/24/23 1101

## 2023-10-24 NOTE — Discharge Instructions (Addendum)
 Please follow-up with your primary care doctor in 1 week for a recheck of your pneumonia symptoms as well as your esophagitis, or esophageal inflammation.  You should start taking Pepcid daily as prescribed.  You can also use viscous lidocaine which is a numbing liquid that you can swallow once every 8 hours as needed for pain.  Try to use this lidocaine sparingly, and for no more than 5 days.   Please note your potassium level was mildly low at 3.3 today.  You can increase your potassium by adding more potassium to your diet.  Review the printed instructions provided in your discharge summary.

## 2023-10-28 ENCOUNTER — Encounter: Payer: Self-pay | Admitting: Family Medicine

## 2023-10-28 ENCOUNTER — Ambulatory Visit (INDEPENDENT_AMBULATORY_CARE_PROVIDER_SITE_OTHER): Payer: 59 | Admitting: Family Medicine

## 2023-10-28 VITALS — BP 128/70 | HR 77 | Temp 97.7°F | Ht 69.0 in | Wt 169.0 lb

## 2023-10-28 DIAGNOSIS — R682 Dry mouth, unspecified: Secondary | ICD-10-CM

## 2023-10-28 DIAGNOSIS — J45909 Unspecified asthma, uncomplicated: Secondary | ICD-10-CM | POA: Insufficient documentation

## 2023-10-28 DIAGNOSIS — K209 Esophagitis, unspecified without bleeding: Secondary | ICD-10-CM | POA: Diagnosis not present

## 2023-10-28 NOTE — Progress Notes (Signed)
 Subjective:    Patient ID: Diane Lee, female    DOB: 1980-10-02, 43 y.o.   MRN: [id number]  Below is an excerpt from the ER physicians note:  This patient presents to the ED with concern for shortness of breath, sore throat, productive cough. This involves an extensive number of treatment options, and is a complaint that carries with it a high risk of complications and morbidity.  The differential diagnosis includes bronchitis versus viral URI versus bacterial pneumonia or airway infection versus pulmonary embolism versus other   Additional history obtained from patient's family members at bedside   External records from outside source obtained and reviewed including cardiology evaluation in the office from April 2023 with Dr.Hilty noting that the patient may have a history of potential SVT but difficult to say, and continuing the patient on metoprolol for symptomatic improvement of her palpitations.  Heart rate was noted to be 80 in the office during that visit.   I ordered and personally interpreted labs.  The pertinent results include: No emergent findings.  White blood cell count 10.6.  Very mild hypokalemia potassium 3.3.  COVID and flu are negative.   I ordered imaging studies including x-ray of the chest, CT PE I independently visualized and interpreted imaging which showed multiple incidental pulmonary nodules, lingula pneumonia, possible esophagitis or esophageal inflammation I agree with the radiologist interpretation   The patient was maintained on a cardiac monitor.  I personally viewed and interpreted the cardiac monitored which showed an underlying rhythm of: Sinus tachycardia and sinus rhythm   Per my interpretation the patient's ECG shows sinus tachycardia with suspected rate related ST changes   I ordered medication including viscous lidocaine for esophagitis, IV azithromycin and Rocephin for community pneumonia; Toradol for pain   I have reviewed the patients home  medicines and have made adjustments as needed   Test Considered: Doubt aortic dissection, doubt stroke   After the interventions noted above, I reevaluated the patient and found that they have: improved   The patient is low risk regarding pulmonary nodules.  No smoking or malignancy history.  Per clinical recommendation she does not need any specific follow-up for this issue.   She is not immunocompromise and she does not have other risk factors for HIV, thrush, candidal esophagitis, in terms of her esophageal irritation.  This may be due to reflux or to her overconsumption of cough pills.  I do not believe she needs any specific treatment for this other than symptomatic control for several days and she can follow-up with her doctor for this painful swallowing issue.   Her heart rate did improve here in the ED under 100 bpm.  Her oxygen remained stable on room air.  She did not require hospitalization; low CURB65 score -reasonable to treat as an outpatient for community pneumonia.     Dispostion:   After consideration of the diagnostic results and the patients response to treatment, I feel that the patent would benefit from close outpatient follow-up.        10/28/23  Patient states that she has been coughing for 2 weeks.  When she went to the emergency room she had a globus sensation with pain in the center of her chest as well as in her upper throat.  The CT scan did show swelling in the esophageal wall consistent with esophagitis.  The patient does not smoke.  She does not drink.  She denies any recent acid reflux.  She states that her  cough has improved dramatically since taking the Z-Pak.  She has no further globus sensation now that she is taking the Pepcid.  She denies any melena or hematochezia.  She denies any recent weight loss.   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     albuterol (VENTOLIN HFA) 108 (90 Base) MCG/ACT inhaler Inhale 2 puffs into the lungs every 4 (four) hours as needed for wheezing or shortness of breath. 6.7 g 3   cetirizine (ZYRTEC) 10 MG tablet Take 10 mg by mouth at bedtime.     cycloSPORINE (RESTASIS) 0.05 % ophthalmic emulsion Place 1 drop into both eyes 2 (two) times daily.     famotidine (PEPCID) 20 MG tablet Take 1 tablet (20 mg total) by mouth 2 (two) times daily. 60 tablet 0   fluticasone (FLONASE) 50 MCG/ACT nasal spray Place 1 spray into both nostrils at bedtime.     ibuprofen (ADVIL) 200 MG tablet Take 600 mg by mouth every 6 (six) hours as needed for mild pain (pain score 1-3) or moderate pain (pain score 4-6).     lidocaine (XYLOCAINE) 2 % solution Use as directed 15 mLs in the mouth or throat every 8 (eight) hours as needed for up to 5 days for mouth pain. 100 mL 0   LO LOESTRIN FE 1 MG-10 MCG / 10 MCG tablet Take 1 tablet by mouth at bedtime.  4   metoprolol succinate (TOPROL-XL) 25 MG 24 hr tablet Take 1 tablet (25 mg total) by mouth daily. 90 tablet 3   Multiple Vitamin (MULTIVITAMIN) tablet Take 2 tablets by mouth daily.     azithromycin (ZITHROMAX) 250 MG tablet Take 1 tablet (250 mg total) by mouth daily for 4 days. Take first 2 tablets together, then 1 every day until finished. (Patient not taking: Reported on 10/28/2023) 4 tablet 0   No current facility-administered medications on file prior to visit.   Allergies  Allergen Reactions   Sulfa Antibiotics Other (See Comments)    Joint pain    Bactrim Other (See Comments)    Pt states that  this med causes joint pain, and her white blood cells and platelets to drop.   Social History   Socioeconomic History   Marital status: Married    Spouse name: Not on file   Number of children: Not on file   Years of education: Not on file   Highest education level: Master's degree (e.g., MA, MS, MEng, MEd, MSW, MBA)  Occupational History   Occupation: Runner, broadcasting/film/video  Tobacco Use   Smoking status: Never   Smokeless tobacco: Never  Vaping Use   Vaping status: Never Used  Substance and Sexual Activity   Alcohol use: No   Drug use: No   Sexual activity: Yes  Other Topics Concern   Not on file  Social History Narrative   Not on file   Social Drivers of Health  Financial Resource Strain: Low Risk  (02/05/2023)   Overall Financial Resource Strain (CARDIA)    Difficulty of Paying Living Expenses: Not hard at all  Food Insecurity: No Food Insecurity (02/05/2023)   Hunger Vital Sign    Worried About Running Out of Food in the Last Year: Never true    Ran Out of Food in the Last Year: Never true  Transportation Needs: No Transportation Needs (02/05/2023)   PRAPARE - Administrator, Civil Service (Medical): No    Lack of Transportation (Non-Medical): No  Physical Activity: Insufficiently Active (02/05/2023)   Exercise Vital Sign    Days of Exercise per Week: 2 days    Minutes of Exercise per Session: 20 min  Stress: No Stress Concern Present (02/05/2023)   [employer] of Occupational Health - Occupational Stress Questionnaire    Feeling of Stress : Not at all  Social Connections: Socially Integrated (02/05/2023)   Social Connection and Isolation Panel [NHANES]    Frequency of Communication with Friends and Family: More than three times a week    Frequency of Social Gatherings with Friends and Family: Twice a week    Attends Religious Services: More than 4 times per year    Active Member of [employer] or Organizations: Yes    Attends Engineer, structural: More than 4  times per year    Marital Status: Married  Catering manager Violence: Not on file      Review of Systems  All other systems reviewed and are negative.      Objective:   Physical Exam Constitutional:      Appearance: Normal appearance. She is normal weight.  Cardiovascular:     Rate and Rhythm: Normal rate and regular rhythm.     Pulses: Normal pulses.     Heart sounds: Normal heart sounds. No murmur heard.    No friction rub. No gallop.  Pulmonary:     Effort: Pulmonary effort is normal. No respiratory distress.     Breath sounds: Normal breath sounds. No stridor.  Musculoskeletal:     Right lower leg: No edema.     Left lower leg: No edema.  Neurological:     Mental Status: She is alert.           Assessment & Plan:   Dry mouth - Plan: Sjogren's syndrome antibods(ssa + ssb)  Esophagitis I believe the patient's esophagitis is from the acid.  I recommended that she take the Pepcid for 1 month and then we will reassess.  If she is having any dysphagia or odynophagia, I would recommend GI consultation for EGD.  If she continues to feel fine this could have been a one-time "fluke".  Patient is comfortable with this.  However she does endorse extremely dry mouth which she has to take Restasis for as well as constant dry mouth.  Therefore I will screen the patient for Sjogren's disease

## 2023-10-29 LAB — SJOGREN'S SYNDROME ANTIBODS(SSA + SSB)
SSA (Ro) (ENA) Antibody, IgG: >8 AI — AB
SSB (La) (ENA) Antibody, IgG: >8 AI — AB

## 2023-10-31 ENCOUNTER — Encounter: Payer: Self-pay | Admitting: Family Medicine

## 2023-11-01 ENCOUNTER — Other Ambulatory Visit: Payer: Self-pay | Admitting: Family Medicine

## 2023-11-01 DIAGNOSIS — M3501 Sicca syndrome with keratoconjunctivitis: Secondary | ICD-10-CM

## 2024-03-02 ENCOUNTER — Encounter: Payer: Self-pay | Admitting: Family Medicine

## 2024-04-01 NOTE — Progress Notes (Signed)
 Office Visit Note  Patient: Diane Lee             Date of Birth: 1980/09/27           MRN: [id number]             PCP: Duanne Butler DASEN, MD Referring: Duanne Butler DASEN, MD Visit Date: 04/10/2024 Occupation: @GUAROCC @  Subjective:  Dry mouth and dry eyes  History of Present Illness: Diane Lee is a 43 y.o. female seen for the evaluation of Sjogren's.  According to the patient her symptoms started about 10 years ago with dry eyes.  She was under care of Dr. Particia Schneider and was treated with Restasis eyedrops.  Her eye symptoms are managed with the Restasis and over-the-counter drops.  She states for the last 2 years she has been having dry mouth and difficulty swallowing.  She mentioned her symptoms to her PCP and had labs in February which came positive for  SSA antibody.  She was referred to me for further evaluation.  She states she developed a sublingual salivary gland inflammation in July 2025.  She was seen by the dentist and was referred to oral surgeon.  She states there also did not mention it was due to a salivary stone but it was not covered by insurance to have it extracted.  She denies any history of shortness of breath.  She states she has had supraventricular tachycardia in April 2023 for which she has been on metoprolol .  She states in February 2025 she was seen in the emergency room with severe pain in her throat and difficulty swallowing.  She states she had a CT scan of her chest.  She was diagnosed with pneumonia and esophagitis.  She has mild reflux symptoms for which she takes Tums.  She has never had EGD.  She states for the last 3 years she has been having pneumonia almost every year.  She denies any lymphadenopathy or inflammatory arthritis or joint pain.  There is no history of Raynaud's.  She gives history of sun sensitivity in the last 2 to 3 years.  She is married, right-handed, works as an Engineering geologist.  She is gravida 2, para 2.  There is no history of  preeclampsia.  Birth control method is Loestrin.  There is no history of DVTs.  She does not drink any alcohol and is non-smoker.  She enjoys walking for exercise.    Activities of Daily Living:  Patient reports morning stiffness for 0 minutes.   Patient Denies nocturnal pain.  Difficulty dressing/grooming: Denies Difficulty climbing stairs: Denies Difficulty getting out of chair: Denies Difficulty using hands for taps, buttons, cutlery, and/or writing: Denies  Review of Systems  Constitutional:  Negative for fatigue.  HENT:  Positive for mouth dryness. Negative for mouth sores.   Eyes:  Positive for dryness.  Respiratory:  Negative for shortness of breath.   Cardiovascular:  Positive for palpitations. Negative for chest pain.  Gastrointestinal:  Negative for blood in stool, constipation and diarrhea.  Endocrine: Negative for increased urination.  Genitourinary:  Negative for involuntary urination.  Musculoskeletal:  Negative for joint pain, gait problem, joint pain, joint swelling, myalgias, muscle weakness, morning stiffness, muscle tenderness and myalgias.  Skin:  Positive for sensitivity to sunlight. Negative for color change, rash and hair loss.  Allergic/Immunologic: Positive for susceptible to infections.  Neurological:  Positive for headaches. Negative for dizziness.  Hematological:  Negative for swollen glands.  Psychiatric/Behavioral:  Negative for  depressed mood and sleep disturbance. The patient is not nervous/anxious.     PMFS History:  Patient Active Problem List   Diagnosis Date Noted   Asthma 10/28/2023   Mild intermittent asthma 08/12/2015   Abnormal Pap smear    Thrombocytopenic, cyclic (HCC)     Past Medical History:  Diagnosis Date   Abnormal Pap smear    Thrombocytopenic, cyclic (HCC)    Was borderline, has been cleared by MD    Family History  Problem Relation Age of Onset   Healthy Mother    Prostate cancer Father    Heart disease Father     Mitral valve prolapse Father    Healthy Sister    Breast cancer Maternal Grandmother    Healthy Son    Healthy Daughter    Past Surgical History:  Procedure Laterality Date   CERVICAL BIOPSY     Benign   TONSILLECTOMY     VAGINAL DELIVERY     WISDOM TOOTH EXTRACTION     Social History   Social History Narrative   Not on file   Immunization History  Administered Date(s) Administered   DTaP 10/18/1980, 12/15/1980, 02/15/1981, 03/21/1982, 01/19/1998   Hepatitis B 12/23/1998, 12/26/1998, 05/09/2001, 10/03/2001   IPV 10/18/1980, 12/15/1980, 02/15/1981, 03/21/1982   Influenza Inj Mdck Quad Pf 08/31/2022   Influenza,inj,Quad PF,6+ Mos 07/07/2013, 07/20/2014, 06/01/2015   Influenza-Unspecified 08/22/2020   MMR 11/11/1981, 02/02/1992   Meningococcal Conjugate 12/23/1998   Td 01/19/1998   Tdap 08/28/2011     Objective: Vital Signs: BP 128/84 (BP Location: Right Arm, Patient Position: Sitting, Cuff Size: Normal)   Pulse 81   Resp 13   Ht 5' 9 (1.753 m)   Wt 169 lb 9.6 oz (76.9 kg)   BMI 25.05 kg/m    Physical Exam Vitals and nursing note reviewed.  Constitutional:      Appearance: She is well-developed.  HENT:     Head: Normocephalic and atraumatic.  Eyes:     Conjunctiva/sclera: Conjunctivae normal.  Cardiovascular:     Rate and Rhythm: Normal rate and regular rhythm.     Heart sounds: Normal heart sounds.  Pulmonary:     Effort: Pulmonary effort is normal.     Breath sounds: Normal breath sounds.  Abdominal:     General: Bowel sounds are normal.     Palpations: Abdomen is soft.  Musculoskeletal:     Cervical back: Normal range of motion.  Lymphadenopathy:     Cervical: No cervical adenopathy.  Skin:    General: Skin is warm and dry.     Capillary Refill: Capillary refill takes less than 2 seconds.  Neurological:     Mental Status: She is alert and oriented to person, place, and time.  Psychiatric:        Behavior: Behavior normal.      Musculoskeletal  Exam: Cervical, thoracic and lumbar spine with good range of motion.  Shoulders, elbow joints, wrist joints, MCPs PIPs and DIPs with good range of motion with no synovitis.  Hip joints, knee joints, ankles, MTPs and PIPs with good range of motion with no synovitis.  CDAI Exam: CDAI Score: -- Patient Global: --; Provider Global: -- Swollen: --; Tender: -- Joint Exam 04/10/2024   No joint exam has been documented for this visit   There is currently no information documented on the homunculus. Go to the Rheumatology activity and complete the homunculus joint exam.  Investigation: No additional findings.  Imaging: No results found.  Recent Labs: Lab  Results  Component Value Date   WBC 10.6 (H) 10/24/2023   HGB 13.5 10/24/2023   PLT 230 10/24/2023   NA 133 (L) 10/24/2023   K 3.3 (L) 10/24/2023   CL 100 10/24/2023   CO2 18 (L) 10/24/2023   GLUCOSE 134 (H) 10/24/2023   BUN 10 10/24/2023   CREATININE 0.92 10/24/2023   BILITOT 0.6 10/24/2023   ALKPHOS 51 10/24/2023   AST 23 10/24/2023   ALT 27 10/24/2023   PROT 9.0 (H) 10/24/2023   ALBUMIN 3.7 10/24/2023   CALCIUM 9.5 10/24/2023   GFRAA 110 05/28/2017   October 28, 2023 SSA> 8.0, SSB> 8.0 October 24, 2023 TSH normal  Speciality Comments: No specialty comments available.  Procedures:  No procedures performed Allergies: Sulfa antibiotics and Bactrim   Assessment / Plan:     Visit Diagnoses: Sjogren's syndrome with other organ involvement (HCC) - Sicca symptoms, positive SSA, positive SSB -patient gives history of dry mouth, dry eyes, frequent dental cavities, difficulty swallowing.  She also gives history of supraventricular tachycardia in the past treated with metoprolol .  She denies history of shortness of breath.  Plan: pilocarpine  (SALAGEN ) 5 MG tablet, CBC with Differential/Platelet, Comprehensive metabolic panel with GFR, Urinalysis, Routine w reflex microscopic, Sedimentation rate, Rheumatoid factor, ANA, Anti-scleroderma  antibody, RNP Antibody, Anti-Smith antibody, Sjogrens syndrome-A extractable nuclear antibody, Sjogrens syndrome-B extractable nuclear antibody, Anti-DNA antibody, double-stranded, C3 and C4, Beta-2  glycoprotein antibodies, Cardiolipin antibodies, IgG, IgM, IgA, Glucose 6 phosphate dehydrogenase.  Detailed counseling on insurance was provided.  Different treatment options were discussed.  Benefits of pilocarpine  was discussed.  Patient sees ophthalmologist on a regular basis and there is no history of increased intraocular pressure.  I gave her prescription for pilocarpine  5 mg p.o. 3 times daily as needed.  Side effects were discussed and a handout was placed in the AVS.  She has been using some over-the-counter products.  Over-the-counter products were discussed at length.  Good dental hygiene with dental visits every 3 months were advised.  Mild intermittent asthma without complication-she has been diagnosed with mild asthma in the past.  She denies any shortness of breath today.  Thrombocytopenic, cyclic (HCC)-patient was evaluated by hematology in the past and no diagnosis was established per patient.  SVT (supraventricular tachycardia) (HCC)-diagnosed few years back and has been on metoprolol  since then.  Recurrent pneumonia-she states she has been having recurrent pneumonia every year for the last 3 years.  Pulmonary nodules-I reviewed CT angio chest from October 24, 2023 which showed large area of consolidation in the peripheral lingula and multiple pulmonary nodules.  I will refer her to pulmonology for evaluation.  Photosensitivity-she gives history of photosensitivity for the last 3 years.  She states she gets hives when she exposed to the sun.  Use of sunscreen was advised.  Orders: Orders Placed This Encounter  Procedures   CBC with Differential/Platelet   Comprehensive metabolic panel with GFR   Urinalysis, Routine w reflex microscopic   Sedimentation rate   Rheumatoid factor    ANA   Anti-scleroderma antibody   RNP Antibody   Anti-Smith antibody   Sjogrens syndrome-A extractable nuclear antibody   Sjogrens syndrome-B extractable nuclear antibody   Anti-DNA antibody, double-stranded   C3 and C4   Beta-2  glycoprotein antibodies   Cardiolipin antibodies, IgG, IgM, IgA   Glucose 6 phosphate dehydrogenase   Ambulatory referral to Pulmonology   Meds ordered this encounter  Medications   pilocarpine  (SALAGEN ) 5 MG tablet    Sig: Take 1  tablet (5 mg total) by mouth 3 (three) times daily as needed.    Dispense:  90 tablet    Refill:  2    Face-to-face time spent with patient was over 45 minutes. Greater than 50% of time was spent in counseling and coordination of care.  Follow-Up Instructions: Return for Sjogren's.   Maya Nash, MD  Note - This record has been created using Animal nutritionist.  Chart creation errors have been sought, but may not always  have been located. Such creation errors do not reflect on  the standard of medical care.

## 2024-04-10 ENCOUNTER — Encounter: Payer: Self-pay | Admitting: Rheumatology

## 2024-04-10 ENCOUNTER — Ambulatory Visit: Attending: Rheumatology | Admitting: Rheumatology

## 2024-04-10 VITALS — BP 128/84 | HR 81 | Resp 13 | Ht 69.0 in | Wt 169.6 lb

## 2024-04-10 DIAGNOSIS — R918 Other nonspecific abnormal finding of lung field: Secondary | ICD-10-CM

## 2024-04-10 DIAGNOSIS — D6949 Other primary thrombocytopenia: Secondary | ICD-10-CM

## 2024-04-10 DIAGNOSIS — J4520 Mild intermittent asthma, uncomplicated: Secondary | ICD-10-CM

## 2024-04-10 DIAGNOSIS — L568 Other specified acute skin changes due to ultraviolet radiation: Secondary | ICD-10-CM

## 2024-04-10 DIAGNOSIS — I471 Supraventricular tachycardia, unspecified: Secondary | ICD-10-CM | POA: Diagnosis not present

## 2024-04-10 DIAGNOSIS — M3509 Sicca syndrome with other organ involvement: Secondary | ICD-10-CM

## 2024-04-10 DIAGNOSIS — J189 Pneumonia, unspecified organism: Secondary | ICD-10-CM

## 2024-04-10 MED ORDER — PILOCARPINE HCL 5 MG PO TABS: 5.0000 mg | ORAL_TABLET | Freq: Three times a day (TID) | ORAL | 2 refills | Status: DC | PRN

## 2024-04-10 NOTE — Patient Instructions (Addendum)
Sjogren's Syndrome Sjgren's syndrome is an inflammatory disease in which the body's disease-fighting system (immune system) attacks the glands that produce tears (lacrimal glands) and the glands that produce saliva (salivary glands). This makes the eyes and mouth very dry. Sjgren's syndrome can also affect other parts of the body, causing dryness of the skin, nose, throat, and vagina. Sjgren's syndrome is a long-term (chronic) disorder that has no cure. In some cases, it is linked to other disorders (rheumatic disorders), such as rheumatoid arthritis and systemic lupus erythematosus (SLE). It may affect other parts of the body, such as the: Blood vessels. Joints. Lungs. Kidneys. Liver or pancreas. Brain, nerves, or spinal cord. What are the causes? The cause of this condition is not known. It may be passed along from parent to child (inherited), or it may be a symptom of a rheumatic disorder. What increases the risk? This condition is more likely to develop in: Women. People who are 54-41 years old and older. People who have recently had a viral infection or currently have a viral infection. What are the signs or symptoms? The main symptoms of this condition are: Dry mouth. This may include: A chalky feeling. Difficulty swallowing, speaking, or tasting. Frequent cavities in the teeth. Frequent mouth infections. Dry eyes. This may include: Burning, redness, and itching. Blurry vision. Fluctuating vision. Light sensitivity. Other symptoms may include: Dryness of the skin and the inside of the nose. Eyelid infections. Vaginal dryness (if applicable). Joint pain and stiffness. Muscle pain and stiffness. How is this diagnosed? This condition is diagnosed based on: Your symptoms. Your medical history. A physical exam of your eyes and mouth. Tests, including: A Schirmer test. This tests your tear production. An eye exam that is done with a magnifying device (slit-lamp exam). An  eye test that temporarily stains your eye with special dyes. This shows the extent of eye damage. Tests to check your salivary gland function. Biopsy. This is a removal of part of a salivary gland from inside your lower lip to be studied under a microscope. Chest X-rays. Blood or urine tests. How is this treated? There is no cure for this condition, but treatment can help you manage your symptoms. You may be asked to see a rheumatologist for further evaluation and treatment. This condition may be treated with: Medicines to help relieve pain and stiffness. Medicines to help relieve inflammation in your body (corticosteroids). These are usually for severe cases. Medicines to help reduce the activity of your immune system (immunosuppressants). These are usually prescribed by your health care provider or a rheumatologist. Moisture replacement therapies to help relieve dryness in your skin, mouth, and eyes. Dry eyes may be treated with: Eye drops or nasal sprays to improve dryness of the eyes. Surgery or insertion of plugs to close the lacrimal glands (punctal occlusion). This helps keep more natural tears in your eyes. Soft contact lenses or hard scleral lenses. These are occasionally used to protect the surface of the eye. Biologic lubricating eye drops (serum tears). These are eye drops made from a person's own blood. They are used in some people with severe dry eye. Follow these instructions at home: Eye care  Use eye drops and other medicines as told by your health care provider. Protect your eyes from the sun and wind with sunglasses or glasses. Blink at least 5-6 times a minute. Maintain properly humidified air. You may want to use a humidifier at home and at work. Avoid smoke. Mouth care Brush your teeth and floss  after every meal. Chew sugar-free gum or suck on hard candy. This may help to relieve dry mouth. Use antimicrobial mouthwash daily. Take frequent sips of water or sugar-free  drinks. Use saliva substitutes or lip balm as told by your health care provider. See your dentist every 6 months. General instructions  Take over-the-counter and prescription medicines only as told by your health care provider. Drink enough fluid to keep your urine pale yellow. Keep all follow-up visits. This is important. Contact a health care provider if: You have a fever. You have night sweats. You are always tired. You have unexplained weight loss. You develop itchy skin. You have red patches on your skin. You have a lump or swelling on your neck. Get help right away if: You develop severe eye pain. You develop sudden decreased vision. Summary Sjgren's syndrome is a disease in which the body's immune system attacks the glands that produce tears and the glands that produce saliva. This condition makes the eyes and mouth very dry. Sjgren's syndrome is a long-term (chronic) disorder. There is no cure for this condition, but treatment can help you manage your symptoms. The cause of this condition is not known. You may be asked to see a rheumatologist for further evaluation and treatment. This information is not intended to replace advice given to you by your health care provider. Make sure you discuss any questions you have with your health care provider. Document Revised: 03/13/2021 Document Reviewed: 03/13/2021 Elsevier Patient Education  2024 Else[employer]  Pilocarpine Tablets What is this medication? PILOCARPINE (PYE loe KAR peen) treats dry mouth. It works by increasing the amount of saliva in the mouth, which makes it easier to speak and swallow. This medicine may be used for other purposes; ask your health care provider or pharmacist if you have questions. COMMON BRAND NAME(S): Salagen What should I tell my care team before I take this medication? They need to know if you have any of these conditions: Eye infection or other eye problems Glaucoma Heart disease Liver  disease Lung or breathing disease, such as asthma An unusual or allergic reaction to pilocarpine, other medications, foods, dyes, or preservatives Pregnant or trying to get pregnant Breast-feeding How should I use this medication? Take this medication by mouth with a full glass of water. Take it as directed on the prescription label at the same time every day. Keep taking it unless your care team tells you to stop. Talk to your care team about the use of this medication in children. Special care may be needed. Overdosage: If you think you have taken too much of this medicine contact a poison control center or emergency room at once. NOTE: This medicine is only for you. Do not share this medicine with others. What if I miss a dose? If you miss a dose, take it as soon as you can. If it is almost time for your next dose, take only that dose. Do not take double or extra doses. What may interact with this medication? Antihistamines for allergy, cough, and cold Atropine Certain medications for Alzheimer disease, such as donepezil, galantamine, rivastigmine Certain medications for bladder problems, such as bethanechol, oxybutynin, tolterodine Certain medications for Parkinson disease, such as benztropine, trihexyphenidyl Certain medications for quitting smoking, such as nicotine Certain medications for stomach problems, such as dicyclomine, hyoscyamine Certain medications for travel sickness, such as scopolamine Ipratropium Medications for blood pressure or heart problems, such as metoprolol This list may not describe all possible interactions. Give your health  care provider a list of all the medicines, herbs, non-prescription drugs, or dietary supplements you use. Also tell them if you smoke, drink alcohol, or use illegal drugs. Some items may interact with your medicine. What should I watch for while using this medication? Visit your care team for regular checks on your progress. Tell your care  team if your symptoms do not get better or if they get worse. You may get blurry vision or have trouble telling how far something is from you. This may be a problem at night or when the lights are low. Do not drive, use machinery, or do anything that needs clear vision until you know how this medication affects you. If you sweat a lot, drink enough to replace fluids. Do not get dehydrated. What side effects may I notice from receiving this medication? Side effects that you should report to your care team as soon as possible: Allergic reactions--skin rash, itching, hives, swelling of the face, lips, tongue, or throat Fast or irregular heartbeat Increase in blood pressure Low blood pressure--dizziness, feeling faint or lightheaded, blurry vision Slow heartbeat--dizziness, feeling faint or lightheaded, confusion, trouble breathing, unusual weakness or fatigue Side effects that usually do not require medical attention (report to your care team if they continue or are bothersome): Change in vision Chills Diarrhea Excessive sweating Flushing Headache Increased need to urinate This list may not describe all possible side effects. Call your doctor for medical advice about side effects. You may report side effects to FDA at 1-800-FDA-1088. Where should I keep my medication? Keep out of the reach of children and pets. Store at room temperature between 15 and 30 degrees C (59 and 86 degrees F). Get rid of any unused medication after the expiration date. To get rid of medications that are no longer needed or have expired: Take the medications to a medication take-back program. Check with your pharmacy or law enforcement to find a location. If your cannot return the medication, check the label or package insert to see if the medication should be thrown out in the garbage or flushed down the toilet. If you are not sure, ask your care team. If it is safe to put it in the trash, take the medication out of  the container. Mix the medication with cat litter, dirt, coffee grounds, or other unwanted substance. Seal the mixture in a bag or container. Put it in the trash. NOTE: This sheet is a summary. It may not cover all possible information. If you have questions about this medicine, talk to your doctor, pharmacist, or health care provider.  2024 Elsevier/Gold Standard (2021-07-28 00:00:00)

## 2024-04-13 ENCOUNTER — Ambulatory Visit: Payer: Self-pay | Admitting: Rheumatology

## 2024-04-13 NOTE — Progress Notes (Signed)
 White cell count is low at 3.2, CMP is stable, UA negative, sed rate mildly elevated at 29, rheumatoid factor positive, ANA remains positive, SSA positive, SSB positive, SCL 70 negative, RNP negative, Smith negative, dsDNA negative, complements normal.  Beta-2  GP 1, anticardiolipin, and G6PD pending.  Labs are suggestive of Sjogren's.  I will discuss results at the follow-up visit.

## 2024-04-15 LAB — COMPREHENSIVE METABOLIC PANEL WITH GFR
AG Ratio: 1 (calc) (ref 1.0–2.5)
ALT: 15 U/L (ref 6–29)
AST: 17 U/L (ref 10–30)
Albumin: 4.1 g/dL (ref 3.6–5.1)
Alkaline phosphatase (APISO): 35 U/L (ref 31–125)
BUN: 13 mg/dL (ref 7–25)
CO2: 27 mmol/L (ref 20–32)
Calcium: 9 mg/dL (ref 8.6–10.2)
Chloride: 104 mmol/L (ref 98–110)
Creat: 0.73 mg/dL (ref 0.50–0.99)
Globulin: 4.2 g/dL — ABNORMAL HIGH (ref 1.9–3.7)
Glucose, Bld: 103 mg/dL — ABNORMAL HIGH (ref 65–99)
Potassium: 4.4 mmol/L (ref 3.5–5.3)
Sodium: 137 mmol/L (ref 135–146)
Total Bilirubin: 0.5 mg/dL (ref 0.2–1.2)
Total Protein: 8.3 g/dL — ABNORMAL HIGH (ref 6.1–8.1)
eGFR: 105 mL/min/1.73m2 (ref >=60)

## 2024-04-15 LAB — URINALYSIS, ROUTINE W REFLEX MICROSCOPIC
Bilirubin Urine: NEGATIVE
Glucose, UA: NEGATIVE
Hgb urine dipstick: NEGATIVE
Ketones, ur: NEGATIVE
Leukocytes,Ua: NEGATIVE
Nitrite: NEGATIVE
Protein, ur: NEGATIVE
Specific Gravity, Urine: 1.012 (ref 1.001–1.035)
pH: 6 (ref 5.0–8.0)

## 2024-04-15 LAB — ANTI-DNA ANTIBODY, DOUBLE-STRANDED: ds DNA Ab: 1 [IU]/mL

## 2024-04-15 LAB — SJOGRENS SYNDROME-A EXTRACTABLE NUCLEAR ANTIBODY: SSA (Ro) (ENA) Antibody, IgG: >8 AI — AB

## 2024-04-15 LAB — CBC WITH DIFFERENTIAL/PLATELET
Absolute Lymphocytes: 1091 {cells}/uL (ref 850–3900)
Absolute Monocytes: 301 {cells}/uL (ref 200–950)
Basophils Absolute: 19 {cells}/uL (ref 0–200)
Basophils Relative: 0.6 %
Eosinophils Absolute: 0 {cells}/uL — ABNORMAL LOW (ref 15–500)
Eosinophils Relative: 0 %
HCT: 40.3 % (ref 35.0–45.0)
Hemoglobin: 13.3 g/dL (ref 11.7–15.5)
MCH: 29.1 pg (ref 27.0–33.0)
MCHC: 33 g/dL (ref 32.0–36.0)
MCV: 88.2 fL (ref 80.0–100.0)
MPV: 10.1 fL (ref 7.5–12.5)
Monocytes Relative: 9.4 %
Neutro Abs: 1789 {cells}/uL (ref 1500–7800)
Neutrophils Relative %: 55.9 %
Platelets: 193 Thousand/uL (ref 140–400)
RBC: 4.57 Million/uL (ref 3.80–5.10)
RDW: 12 % (ref 11.0–15.0)
Total Lymphocyte: 34.1 %
WBC: 3.2 Thousand/uL — ABNORMAL LOW (ref 3.8–10.8)

## 2024-04-15 LAB — BETA-2 GLYCOPROTEIN ANTIBODIES
Beta-2 Glyco 1 IgA: <2 U/mL (ref <20.0)
Beta-2 Glyco 1 IgM: 3.2 U/mL (ref <20.0)
Beta-2 Glyco I IgG: <2 U/mL (ref <20.0)

## 2024-04-15 LAB — RHEUMATOID FACTOR: Rheumatoid fact SerPl-aCnc: 82 [IU]/mL — ABNORMAL HIGH (ref <14)

## 2024-04-15 LAB — CARDIOLIPIN ANTIBODIES, IGG, IGM, IGA
Anticardiolipin IgA: <2 [APL'U]/mL (ref <20.0)
Anticardiolipin IgG: <2 [GPL'U]/mL (ref <20.0)
Anticardiolipin IgM: <2 [MPL'U]/mL (ref <20.0)

## 2024-04-15 LAB — ANTI-NUCLEAR AB-TITER (ANA TITER)
ANA TITER: 1:80 {titer} — ABNORMAL HIGH
ANA Titer 1: 1:1280 {titer} — ABNORMAL HIGH

## 2024-04-15 LAB — RNP ANTIBODY: Ribonucleic Protein(ENA) Antibody, IgG: <1 AI

## 2024-04-15 LAB — ANTI-SCLERODERMA ANTIBODY: Scleroderma (Scl-70) (ENA) Antibody, IgG: <1 AI

## 2024-04-15 LAB — ANA: Anti Nuclear Antibody (ANA): POSITIVE — AB

## 2024-04-15 LAB — C3 AND C4
C3 Complement: 143 mg/dL (ref 83–193)
C4 Complement: 18 mg/dL (ref 15–57)

## 2024-04-15 LAB — SEDIMENTATION RATE: Sed Rate: 29 mm/h — ABNORMAL HIGH (ref 0–20)

## 2024-04-15 LAB — SJOGRENS SYNDROME-B EXTRACTABLE NUCLEAR ANTIBODY: SSB (La) (ENA) Antibody, IgG: >8 AI — AB

## 2024-04-15 LAB — ANTI-SMITH ANTIBODY: ENA SM Ab Ser-aCnc: <1 AI

## 2024-04-15 LAB — GLUCOSE 6 PHOSPHATE DEHYDROGENASE: G-6PDH: 26.1 U/g{Hb} — ABNORMAL HIGH (ref 7.0–20.5)

## 2024-04-15 NOTE — Progress Notes (Signed)
 Labs are suggestive of Sjogren's disease.  I will discuss results at the follow-up visit.

## 2024-05-01 NOTE — Progress Notes (Signed)
 Office Visit Note  Patient: Diane Lee             Date of Birth: 1981/07/05           MRN: [id number]             PCP: Duanne Butler DASEN, MD Referring: Duanne Butler DASEN, MD Visit Date: 05/13/2024 Occupation: @GUAROCC @  Subjective:  Dry mouth and dry eyes   History of Present Illness: Diane Lee is a 43 y.o. female with Sjogren's disease.  She returns today after her last visit on April 10, 2024.  She continues to have dry mouth and dry eye symptoms.  She states her symptoms are much improved since she has been using pilocarpine .  She states they are effective pilocarpine  rinse out after few hours and she has to do 3 times daily dosing.  She has not tried any over-the-counter products yet.  She denies any palpitations or shortness of breath.  She has an appointment with the pulmonologist in October.  She denies any history of joint swelling.    Activities of Daily Living:  Patient reports morning stiffness for 0 minutes.   Patient Denies nocturnal pain.  Difficulty dressing/grooming: Denies Difficulty climbing stairs: Denies Difficulty getting out of chair: Denies Difficulty using hands for taps, buttons, cutlery, and/or writing: Denies  Review of Systems  Constitutional:  Negative for fatigue.  HENT:  Positive for mouth dryness. Negative for mouth sores and nose dryness.   Eyes:  Positive for dryness.  Respiratory:  Negative for shortness of breath.   Cardiovascular:  Negative for chest pain and palpitations.  Gastrointestinal:  Negative for blood in stool, constipation and diarrhea.  Endocrine: Negative for increased urination.  Genitourinary:  Negative for involuntary urination.  Musculoskeletal:  Negative for joint pain, gait problem, joint pain, joint swelling, myalgias, muscle weakness, morning stiffness, muscle tenderness and myalgias.  Skin:  Positive for hair loss and sensitivity to sunlight. Negative for color change and rash.  Allergic/Immunologic: Positive  for susceptible to infections.  Neurological:  Negative for dizziness and headaches.  Hematological:  Negative for swollen glands.  Psychiatric/Behavioral:  Negative for depressed mood and sleep disturbance. The patient is not nervous/anxious.     PMFS History:  Patient Active Problem List   Diagnosis Date Noted   Sjogren's disease (HCC) 05/13/2024   Asthma 10/28/2023   Mild intermittent asthma 08/12/2015   Abnormal Pap smear    Thrombocytopenic, cyclic (HCC)     Past Medical History:  Diagnosis Date   Abnormal Pap smear    Thrombocytopenic, cyclic (HCC)    Was borderline, has been cleared by MD    Family History  Problem Relation Age of Onset   Healthy Mother    Prostate cancer Father    Heart disease Father    Mitral valve prolapse Father    Healthy Sister    Breast cancer Maternal Grandmother    Healthy Son    Healthy Daughter    Past Surgical History:  Procedure Laterality Date   CERVICAL BIOPSY     Benign   TONSILLECTOMY     VAGINAL DELIVERY     WISDOM TOOTH EXTRACTION     Social History   Social History Narrative   Not on file   Immunization History  Administered Date(s) Administered   DTaP 10/18/1980, 12/15/1980, 02/15/1981, 03/21/1982, 01/19/1998   Hepatitis B 12/23/1998, 12/26/1998, 05/09/2001, 10/03/2001   IPV 10/18/1980, 12/15/1980, 02/15/1981, 03/21/1982   Influenza Inj Mdck Quad Pf 08/31/2022  Influenza,inj,Quad PF,6+ Mos 07/07/2013, 07/20/2014, 06/01/2015   Influenza-Unspecified 08/22/2020   MMR 11/11/1981, 02/02/1992   Meningococcal Conjugate 12/23/1998   Td 01/19/1998   Tdap 08/28/2011     Objective: Vital Signs: BP 126/84   Pulse 73   Temp 97.8 F (36.6 C)   Resp 15   Ht 5' 9 (1.753 m)   Wt 169 lb 6.4 oz (76.8 kg)   BMI 25.02 kg/m    Physical Exam Vitals and nursing note reviewed.  Constitutional:      Appearance: She is well-developed.  HENT:     Head: Normocephalic and atraumatic.  Eyes:     Conjunctiva/sclera:  Conjunctivae normal.  Cardiovascular:     Rate and Rhythm: Normal rate and regular rhythm.     Heart sounds: Normal heart sounds.  Pulmonary:     Effort: Pulmonary effort is normal.     Breath sounds: Normal breath sounds.  Abdominal:     General: Bowel sounds are normal.     Palpations: Abdomen is soft.  Musculoskeletal:     Cervical back: Normal range of motion.  Lymphadenopathy:     Cervical: No cervical adenopathy.  Skin:    General: Skin is warm and dry.     Capillary Refill: Capillary refill takes less than 2 seconds.  Neurological:     Mental Status: She is alert and oriented to person, place, and time.  Psychiatric:        Behavior: Behavior normal.      Musculoskeletal Exam: Cervical, thoracic and lumbar spine were in good range of motion.  Shoulders, elbows, wrist joints, MCPs PIPs and DIPs with good range of motion with no synovitis.  Hip joints and knee joints in good range of motion.  There was no tenderness over ankles or MTPs.  CDAI Exam: CDAI Score: -- Patient Global: --; Provider Global: -- Swollen: --; Tender: -- Joint Exam 05/13/2024   No joint exam has been documented for this visit   There is currently no information documented on the homunculus. Go to the Rheumatology activity and complete the homunculus joint exam.  Investigation: No additional findings.  Imaging: No results found.  Recent Labs: Lab Results  Component Value Date   WBC 3.2 (L) 04/10/2024   HGB 13.3 04/10/2024   PLT 193 04/10/2024   NA 137 04/10/2024   K 4.4 04/10/2024   CL 104 04/10/2024   CO2 27 04/10/2024   GLUCOSE 103 (H) 04/10/2024   BUN 13 04/10/2024   CREATININE 0.73 04/10/2024   BILITOT 0.5 04/10/2024   ALKPHOS 51 10/24/2023   AST 17 04/10/2024   ALT 15 04/10/2024   PROT 8.3 (H) 04/10/2024   ALBUMIN 3.7 10/24/2023   CALCIUM 9.0 04/10/2024   GFRAA 110 05/28/2017   April 10, 2024 UA negative, ANA 1: 1280 NS, 1: 80 cytoplasmic, SSA > 8.0, SSB> 8.0, Smith  negative, dsDNA negative, RNP negative, SCL 70 negative, C3-C4 normal, beta-2  GP 1 negative, anticardiolipin negative, RF 82, sed rate 29, G6PD 26.1  Speciality Comments: No specialty comments available.  Procedures:  No procedures performed Allergies: Sulfa antibiotics and Bactrim   Assessment / Plan:     Visit Diagnoses: Sjogren's syndrome with other organ involvement (HCC) - Positive ANA, positive SSA, positive SSB, sicca symptoms, frequent dental cavities, difficulty swallowing, history of SVT.  Patient had a good response to pilocarpine .  She has been taking pilocarpine  5 mg twice daily to 3 times daily.  She is not using any over-the-counter products.  I  encouraged using over-the-counter products.  If she has ongoing symptoms Evoxac could be another option.April 10, 2024 UA negative, ANA 1: 1280 NS, 1: 80 cytoplasmic, SSA > 8.0, SSB> 8.0, Smith negative, dsDNA negative, RNP negative, SCL 70 negative, C3-C4 normal, beta-2  GP 1 negative, anticardiolipin negative, RF 82, sed rate 29, G6PD 26.1.  Labs were reviewed with the patient.  Detailed counseling on insurance was provided.  Association of Sjogren's with ILD, lymphoma and arrhythmias was discussed.  She had no parotid swelling or lymphadenopathy.  Chest was clear to auscultation.  Mild intermittent asthma without complication-she continues to have asthma symptoms which are mild.  Thrombocytopenic, cyclic (HCC)-she had hematology workup in the past which was inconclusive for patient.  SVT (supraventricular tachycardia) (HCC)-she was placed on metoprolol  in the past.  Recurrent pneumonia-annually for the last 3 years per patient.  Pulmonary nodules - CT angio chest consolidation in the peripheral lingula and multiple pulmonary nodules patient was referred to pulmonologist.  She has an appointment in October.  Photosensitivity-rash associated with Sjogren's was discussed.  Use of sunscreen was emphasized.  Orders: Orders Placed This  Encounter  Procedures   Protein / creatinine ratio, urine   Anti-DNA antibody, double-stranded   C3 and C4   Sedimentation rate   Serum protein electrophoresis with reflex   CBC with Differential/Platelet   Comprehensive metabolic panel with GFR   ANA   VITAMIN D 25 Hydroxy (Vit-D Deficiency, Fractures)   No orders of the defined types were placed in this encounter.    Follow-Up Instructions: Return in about 6 months (around 11/10/2024) for Sjogren's.   Maya Nash, MD  Note - This record has been created using Animal nutritionist.  Chart creation errors have been sought, but may not always  have been located. Such creation errors do not reflect on  the standard of medical care.

## 2024-05-02 ENCOUNTER — Other Ambulatory Visit: Payer: Self-pay | Admitting: Family Medicine

## 2024-05-02 DIAGNOSIS — R002 Palpitations: Secondary | ICD-10-CM

## 2024-05-04 NOTE — Telephone Encounter (Signed)
 Requested medication (s) are due for refill today: yes  Requested medication (s) are on the active medication list: yes  Last refill:  02/05/24 #90 3 RF  Future visit scheduled: no  Notes to clinic:  overdue for OV- sent pt message via MyChart to call and schedule appt   Requested Prescriptions  Pending Prescriptions Disp Refills   metoprolol  succinate (TOPROL -XL) 25 MG 24 hr tablet [Pharmacy Med Name: METOPROLOL  SUCC ER 25 MG TAB] 90 tablet 3    Sig: Take 1 tablet (25 mg total) by mouth daily.     Cardiovascular:  Beta Blockers Failed - 05/04/2024  1:23 PM      Failed - Valid encounter within last 6 months    Recent Outpatient Visits           6 months ago Dry mouth   [place] [facility] Pickard, Butler DASEN, MD   1 year ago Palpitations   [place] [facility] Pickard, Butler DASEN, MD       Future Appointments             In 1 week Dolphus Reiter, MD [facility] [place]            Passed - Last BP in normal range    BP Readings from Last 1 Encounters:  04/10/24 128/84         Passed - Last Heart Rate in normal range    Pulse Readings from Last 1 Encounters:  04/10/24 81

## 2024-05-08 ENCOUNTER — Other Ambulatory Visit: Payer: Self-pay | Admitting: Medical Genetics

## 2024-05-13 ENCOUNTER — Encounter: Payer: Self-pay | Admitting: Rheumatology

## 2024-05-13 ENCOUNTER — Ambulatory Visit: Attending: Rheumatology | Admitting: Rheumatology

## 2024-05-13 VITALS — BP 126/84 | HR 73 | Temp 97.8°F | Resp 15 | Ht 69.0 in | Wt 169.4 lb

## 2024-05-13 DIAGNOSIS — L568 Other specified acute skin changes due to ultraviolet radiation: Secondary | ICD-10-CM

## 2024-05-13 DIAGNOSIS — I471 Supraventricular tachycardia, unspecified: Secondary | ICD-10-CM

## 2024-05-13 DIAGNOSIS — M3509 Sicca syndrome with other organ involvement: Secondary | ICD-10-CM | POA: Diagnosis not present

## 2024-05-13 DIAGNOSIS — J4520 Mild intermittent asthma, uncomplicated: Secondary | ICD-10-CM

## 2024-05-13 DIAGNOSIS — E559 Vitamin D deficiency, unspecified: Secondary | ICD-10-CM

## 2024-05-13 DIAGNOSIS — J189 Pneumonia, unspecified organism: Secondary | ICD-10-CM

## 2024-05-13 DIAGNOSIS — R918 Other nonspecific abnormal finding of lung field: Secondary | ICD-10-CM

## 2024-05-13 DIAGNOSIS — M3500 Sicca syndrome, unspecified: Secondary | ICD-10-CM | POA: Insufficient documentation

## 2024-05-13 DIAGNOSIS — D6949 Other primary thrombocytopenia: Secondary | ICD-10-CM

## 2024-05-13 NOTE — Patient Instructions (Addendum)
 Standing Labs We placed an order today for your standing lab work.   Please have your standing labs drawn in March  Please have your labs drawn 2 weeks prior to your appointment so that the provider can discuss your lab results at your appointment, if possible.  Please note that you may see your imaging and lab results in MyChart before we have reviewed them. We will contact you once all results are reviewed. Please allow our office up to 72 hours to thoroughly review all of the results before contacting the office for clarification of your results.  WALK-IN LAB HOURS  Monday through Thursday from 8:00 am -12:30 pm and 1:00 pm-4:30 pm and Friday from 8:00 am-12:00 pm.  Patients with office visits requiring labs will be seen before walk-in labs.  You may encounter longer than normal wait times. Please allow additional time. Wait times may be shorter on  Monday and Thursday afternoons.  We do not book appointments for walk-in labs. We appreciate your patience and understanding with our staff.   Labs are drawn by [facility]. Please bring your co-pay at the time of your lab draw.  You may receive a bill from [facility] for your lab work.  Please note if you are on Hydroxychloroquine and and an order has been placed for a Hydroxychloroquine level,  you will need to have it drawn 4 hours or more after your last dose.  If you wish to have your labs drawn at another location, please call the office 24 hours in advance so we can fax the orders.  The office is located at [address], [place], [place] [address]   If you have any questions regarding directions or hours of operation,  please call [phone].   As a reminder, please drink plenty of water prior to coming for your lab work. Thanks!

## 2024-05-15 ENCOUNTER — Ambulatory Visit: Admitting: Family Medicine

## 2024-05-15 ENCOUNTER — Encounter: Payer: Self-pay | Admitting: Family Medicine

## 2024-05-15 VITALS — BP 124/62 | HR 74 | Temp 98.1°F | Ht 69.0 in | Wt 168.4 lb

## 2024-05-15 DIAGNOSIS — I471 Supraventricular tachycardia, unspecified: Secondary | ICD-10-CM | POA: Diagnosis not present

## 2024-05-15 DIAGNOSIS — M3501 Sicca syndrome with keratoconjunctivitis: Secondary | ICD-10-CM

## 2024-05-15 DIAGNOSIS — M3500 Sicca syndrome, unspecified: Secondary | ICD-10-CM | POA: Insufficient documentation

## 2024-05-15 DIAGNOSIS — D72819 Decreased white blood cell count, unspecified: Secondary | ICD-10-CM

## 2024-05-15 DIAGNOSIS — Z23 Encounter for immunization: Secondary | ICD-10-CM

## 2024-05-15 DIAGNOSIS — R002 Palpitations: Secondary | ICD-10-CM

## 2024-05-15 DIAGNOSIS — Z124 Encounter for screening for malignant neoplasm of cervix: Secondary | ICD-10-CM

## 2024-05-15 MED ORDER — METOPROLOL SUCCINATE ER 25 MG PO TB24: 25.0000 mg | ORAL_TABLET | Freq: Every day | ORAL | 3 refills | Status: AC

## 2024-05-15 NOTE — Addendum Note (Signed)
 Addended by: ANGELENA RONAL BRADLEY K on: 05/15/2024 04:26 PM   Modules accepted: Orders

## 2024-05-15 NOTE — Progress Notes (Signed)
 Subjective:    Patient ID: Diane Lee, female    DOB: [birth year], 43 y.o.   MRN: [id number]  Patient is a very sweet 43 year old Caucasian female here today for a checkup.  She is currently on metoprolol  to help prevent paroxysmal supraventricular tachycardia.  She states that as long as she takes the metoprolol , her heart rate remains well-controlled.  She denies any fatigue.  She denies any lightheadedness or near syncope.  She denies any episodes of hypotension.  At the patient's last office visit, she was diagnosed with Sjogren's syndrome.  She is currently seeing rheumatology.  They have recommended follow-up with a pulmonologist.  The patient has had pneumonia on 3 separate occasions.  Today we discussed the pneumonia vaccine.  She would like to get her flu shot today.  She has an appointment to see her pulmonologist coming up.  She will ask them about the pneumonia vaccine.  She does not want to get both on the same day.  She sees a gynecologist who performs her Pap smear and her mammogram.  She is not yet due for a colonoscopy.  I reviewed her most recent lab work which in addition to the positive Sjogren profile also showed an elevated sedimentation rate, a positive ANA with a high titer, and a positive RF factor.  Patient denies any joint pains, rashes, fatigue.   Past Medical History:  Diagnosis Date   Abnormal Pap smear    Thrombocytopenic, cyclic (HCC)    Was borderline, has been cleared by MD   Past Surgical History:  Procedure Laterality Date   CERVICAL BIOPSY     Benign   TONSILLECTOMY     VAGINAL DELIVERY     WISDOM TOOTH EXTRACTION     Current Outpatient Medications on File Prior to Visit  Medication Sig Dispense Refill   acetaminophen  (TYLENOL ) 500 MG tablet Take 1,000 mg by mouth every 6 (six) hours as needed for mild pain (pain score 1-3) or moderate pain (pain score 4-6).     albuterol  (VENTOLIN  HFA) 108 (90 Base) MCG/ACT inhaler Inhale 2 puffs into the lungs every 4  (four) hours as needed for wheezing or shortness of breath. 6.7 g 3   cetirizine (ZYRTEC) 10 MG tablet Take 10 mg by mouth at bedtime.     cycloSPORINE (RESTASIS) 0.05 % ophthalmic emulsion Place 1 drop into both eyes 2 (two) times daily.     famotidine  (PEPCID ) 20 MG tablet Take 1 tablet (20 mg total) by mouth 2 (two) times daily. 60 tablet 0   fluticasone (FLONASE) 50 MCG/ACT nasal spray Place 1 spray into both nostrils at bedtime.     ibuprofen  (ADVIL ) 200 MG tablet Take 600 mg by mouth every 6 (six) hours as needed for mild pain (pain score 1-3) or moderate pain (pain score 4-6).     LO LOESTRIN FE 1 MG-10 MCG / 10 MCG tablet Take 1 tablet by mouth at bedtime.  4   metoprolol  succinate (TOPROL -XL) 25 MG 24 hr tablet Take 1 tablet (25 mg total) by mouth daily. 90 tablet 3   Multiple Vitamin (MULTIVITAMIN) tablet Take 2 tablets by mouth daily.     pilocarpine  (SALAGEN ) 5 MG tablet Take 1 tablet (5 mg total) by mouth 3 (three) times daily as needed. 90 tablet 2   No current facility-administered medications on file prior to visit.   Allergies  Allergen Reactions   Sulfa Antibiotics Other (See Comments)    Joint pain    Bactrim  Other (See Comments)    Pt states that this med causes joint pain, and her white blood cells and platelets to drop.   Social History   Socioeconomic History   Marital status: Married    Spouse name: Not on file   Number of children: Not on file   Years of education: Not on file   Highest education level: Master's degree (e.g., MA, MS, MEng, MEd, MSW, MBA)  Occupational History   Occupation: Runner, broadcasting/film/video  Tobacco Use   Smoking status: Never    Passive exposure: Never   Smokeless tobacco: Never  Vaping Use   Vaping status: Never Used  Substance and Sexual Activity   Alcohol use: No   Drug use: No   Sexual activity: Yes  Other Topics Concern   Not on file  Social History Narrative   Not on file   Social Drivers of Health   Financial Resource Strain: Low  Risk  (02/05/2023)   Overall Financial Resource Strain (CARDIA)    Difficulty of Paying Living Expenses: Not hard at all  Food Insecurity: No Food Insecurity (02/05/2023)   Hunger Vital Sign    Worried About Running Out of Food in the Last Year: Never true    Ran Out of Food in the Last Year: Never true  Transportation Needs: No Transportation Needs (02/05/2023)   PRAPARE - Administrator, Civil Service (Medical): No    Lack of Transportation (Non-Medical): No  Physical Activity: Insufficiently Active (02/05/2023)   Exercise Vital Sign    Days of Exercise per Week: 2 days    Minutes of Exercise per Session: 20 min  Stress: No Stress Concern Present (02/05/2023)   [employer] of Occupational Health - Occupational Stress Questionnaire    Feeling of Stress : Not at all  Social Connections: Socially Integrated (02/05/2023)   Social Connection and Isolation Panel    Frequency of Communication with Friends and Family: More than three times a week    Frequency of Social Gatherings with Friends and Family: Twice a week    Attends Religious Services: More than 4 times per year    Active Member of [employer] or Organizations: Yes    Attends Engineer, structural: More than 4 times per year    Marital Status: Married  Catering manager Violence: Not on file      Review of Systems  All other systems reviewed and are negative.      Objective:   Physical Exam Constitutional:      Appearance: Normal appearance. She is normal weight.  Cardiovascular:     Rate and Rhythm: Normal rate and regular rhythm.     Pulses: Normal pulses.     Heart sounds: Normal heart sounds. No murmur heard.    No friction rub. No gallop.  Pulmonary:     Effort: Pulmonary effort is normal. No respiratory distress.     Breath sounds: Normal breath sounds. No stridor.  Musculoskeletal:     Right lower leg: No edema.     Left lower leg: No edema.  Neurological:     Mental Status: She is alert.            Assessment & Plan:   Sjogren's syndrome with keratoconjunctivitis sicca (HCC)  Palpitations - Plan: metoprolol  succinate (TOPROL -XL) 25 MG 24 hr tablet  PSVT (paroxysmal supraventricular tachycardia) (HCC) Patient has elected to continue metoprolol  for prevention and management of paroxysmal supraventricular tachycardia.  She is currently on pilocarpine  for Sjogren syndrome.  She denies any flushing, facial swelling, urinary diuresis, abdominal pain, or diarrhea.  Overall she is doing very well.  Rheumatology is monitoring her lab work.  She denies any symptoms of a systemic autoimmune disease similar to lupus or rheumatoid arthritis.  We will monitor her clinically for this.  She received her flu shot today.  I have recommended the pneumonia vaccine given her history of pneumonia.  She would like to get this with her pulmonologist.

## 2024-06-03 ENCOUNTER — Ambulatory Visit: Admitting: Pulmonary Disease

## 2024-06-03 ENCOUNTER — Encounter: Payer: Self-pay | Admitting: Pulmonary Disease

## 2024-06-03 VITALS — BP 115/82 | HR 87 | Temp 98.0°F | Ht 69.0 in | Wt 170.2 lb

## 2024-06-03 DIAGNOSIS — J4520 Mild intermittent asthma, uncomplicated: Secondary | ICD-10-CM | POA: Diagnosis not present

## 2024-06-03 DIAGNOSIS — R911 Solitary pulmonary nodule: Secondary | ICD-10-CM

## 2024-06-03 MED ORDER — ALBUTEROL SULFATE HFA 108 (90 BASE) MCG/ACT IN AERS: 2.0000 | INHALATION_SPRAY | RESPIRATORY_TRACT | 3 refills | Status: AC | PRN

## 2024-06-03 NOTE — Patient Instructions (Addendum)
 Nice to meet you  I ordered a CT scan to keep an eye on the nodule and to make sure the area of pneumonia improved from February, I think it is all related to an infectious process given your symptoms but reasonable to double check  I refilled albuterol , use as needed as you are.  If you notice that you are getting bronchitis or cough or shortness of breath in the coming months we could consider a regular or maintenance inhaler to help minimize those symptoms in the future.  Return to clinic in 6 months or sooner as needed with Dr. Annella

## 2024-06-03 NOTE — Progress Notes (Signed)
 @Patient  ID: Alfonso LITTIE Free, female    DOB: 06-15-1981, 43 y.o.   MRN: [id number]  Chief Complaint  Patient presents with  . Consult    Lung nodules on inside of lungs     Referring provider: Dolphus Reiter, MD  HPI:   43 y.o. woman with history of Sjogren's here for evaluation of abnormal lung imaging, CT scan of the chest.  Multiple rheumatology notes reviewed.  ED note 09/2023 at time of CT scan reviewed.  Patient was seen in the ED 09/2023.  She had 1 week of preceding severe cough.  Developed shortness of breath some chest tightness as well.  She presented to ED with significant pain in her throat or upper chest.  She felt like was on fire.  She wondered if she had burned it with multiple cough drops etc.  She is told she was diagnosed with esophagitis.  A chest x-ray revealed a left sided infiltrate.  CT scan revealed lingular opacity and scattered left-sided nodules on my review interpretation.  She was given IV antibiotics.  She was discharged with oral azithromycin .  Her symptoms improved with this treatment.  In the interim she met with a rheumatologist.  She is been diagnosed with Sjogren's.  Rheumatologist was concerned about the lung nodules and recommended pulmonary evaluation for further discussion and management.  In the past has been diagnosed with asthma.  Review of serial chest x-rays dating back to 2016 revealed clear lungs with occasional scattered hazy opacities as well as evidence of hyperinflation on my review and interpretation.  There are chest x-rays that show clearance of these opacities from time to time.  In terms of her asthma: Symptoms seem well-controlled.  She reports rare albuterol  use.  Occasionally with change of seasons or illness she has a little chest tightness and shortness of breath.  Albuterol  provides relief.  She is on no maintenance inhaler.  She does describe recurrent pneumonias in the past.  Maybe 3 since 2018.  Every year or  2.  Questionaires / Pulmonary Flowsheets:   ACT:      No data to display          MMRC:     No data to display          Epworth:      No data to display          Tests:   FENO:  No results found for: NITRICOXIDE  PFT:    Latest Ref Rng & Units 08/12/2015   12:51 PM  PFT Results  FVC-Pre L 4.60   FVC-Predicted Pre % 105   FVC-Post L 4.64   FVC-Predicted Post % 105   Pre FEV1/FVC % % 83   Post FEV1/FCV % % 86   FEV1-Pre L 3.82   FEV1-Predicted Pre % 106   FEV1-Post L 4.01   DLCO uncorrected ml/min/mmHg 25.87   DLCO UNC% % 83   DLVA Predicted % 86   TLC L 6.01   TLC % Predicted % 103   RV % Predicted % 90   Personally viewed interpret as normal spirometry, no bronchodilator response, lung volumes within normal notes, DLCO within normal notes.  WALK:      No data to display          Imaging: Personally viewed and as per EMR and discussion in this note No results found.  Lab Results: Personally reviewed CBC    Component Value Date/Time   WBC 3.2 (L) 04/10/2024 0000  RBC 4.57 04/10/2024 0000   HGB 13.3 04/10/2024 0000   HGB 10.8 (L) 08/15/2011 1535   HCT 40.3 04/10/2024 0000   HCT 31.5 (L) 08/15/2011 1535   PLT 193 04/10/2024 0000   PLT 150 08/15/2011 1535   MCV 88.2 04/10/2024 0000   MCV 88.6 08/15/2011 1535   MCH 29.1 04/10/2024 0000   MCHC 33.0 04/10/2024 0000   RDW 12.0 04/10/2024 0000   RDW 12.9 08/15/2011 1535   LYMPHSABS 1,155 05/28/2017 1510   LYMPHSABS 1.1 08/15/2011 1535   MONOABS 0.4 10/27/2015 1658   MONOABS 0.4 08/15/2011 1535   EOSABS 0 (L) 04/10/2024 0000   EOSABS 0.0 08/15/2011 1535   BASOSABS 19 04/10/2024 0000   BASOSABS 0.0 08/15/2011 1535    BMET    Component Value Date/Time   NA 137 04/10/2024 0000   K 4.4 04/10/2024 0000   CL 104 04/10/2024 0000   CO2 27 04/10/2024 0000   GLUCOSE 103 (H) 04/10/2024 0000   BUN 13 04/10/2024 0000   CREATININE 0.73 04/10/2024 0000   CALCIUM 9.0 04/10/2024 0000    GFRNONAA >60 10/24/2023 0712   GFRNONAA 95 05/28/2017 1510   GFRAA 110 05/28/2017 1510    BNP No results found for: BNP  ProBNP No results found for: PROBNP  Specialty Problems       Pulmonary Problems   Mild intermittent asthma   Asthma    Allergies  Allergen Reactions  . Sulfa Antibiotics Other (See Comments)    Joint pain   . Bactrim Other (See Comments)    Pt states that this med causes joint pain, and her white blood cells and platelets to drop.    Immunization History  Administered Date(s) Administered  . DTaP 10/18/1980, 12/15/1980, 02/15/1981, 03/21/1982, 01/19/1998  . Hepatitis B 12/23/1998, 12/26/1998, 05/09/2001, 10/03/2001  . IPV 10/18/1980, 12/15/1980, 02/15/1981, 03/21/1982  . Influenza Inj Mdck Quad Pf 08/31/2022  . Influenza, Seasonal, Injecte, Preservative Fre 05/15/2024  . Influenza,inj,Quad PF,6+ Mos 07/07/2013, 07/20/2014, 06/01/2015, 06/26/2019  . Influenza-Unspecified 08/22/2020, 08/23/2023  . MMR 11/11/1981, 02/02/1992  . Meningococcal Conjugate 12/23/1998  . Td 01/19/1998  . Tdap 08/28/2011    Past Medical History:  Diagnosis Date  . Abnormal Pap smear   . Leukopenia   . PSVT (paroxysmal supraventricular tachycardia)   . Sjogren's syndrome     Tobacco History: Social History   Tobacco Use  Smoking Status Never  . Passive exposure: Never  Smokeless Tobacco Never   Counseling given: Not Answered   Continue to not smoke  Outpatient Encounter Medications as of 06/03/2024  Medication Sig  . acetaminophen  (TYLENOL ) 500 MG tablet Take 1,000 mg by mouth every 6 (six) hours as needed for mild pain (pain score 1-3) or moderate pain (pain score 4-6).  . cetirizine (ZYRTEC) 10 MG tablet Take 10 mg by mouth at bedtime.  . cycloSPORINE (RESTASIS) 0.05 % ophthalmic emulsion Place 1 drop into both eyes 2 (two) times daily.  . ibuprofen  (ADVIL ) 200 MG tablet Take 600 mg by mouth every 6 (six) hours as needed for mild pain (pain score 1-3)  or moderate pain (pain score 4-6).  . LO LOESTRIN FE 1 MG-10 MCG / 10 MCG tablet Take 1 tablet by mouth at bedtime.  . metoprolol  succinate (TOPROL -XL) 25 MG 24 hr tablet Take 1 tablet (25 mg total) by mouth daily.  . Multiple Vitamin (MULTIVITAMIN) tablet Take 2 tablets by mouth daily.  . pilocarpine  (SALAGEN ) 5 MG tablet Take 1 tablet (5 mg total) by  mouth 3 (three) times daily as needed.  . [DISCONTINUED] albuterol  (VENTOLIN  HFA) 108 (90 Base) MCG/ACT inhaler Inhale 2 puffs into the lungs every 4 (four) hours as needed for wheezing or shortness of breath.  . albuterol  (VENTOLIN  HFA) 108 (90 Base) MCG/ACT inhaler Inhale 2 puffs into the lungs every 4 (four) hours as needed for wheezing or shortness of breath.   No facility-administered encounter medications on file as of 06/03/2024.     Review of Systems  Review of Systems  No chest pain with exertion.  No orthopnea or PND.  Comprehensive review of systems otherwise negative. Physical Exam  BP 115/82   Pulse 87   Temp 98 F (36.7 C) (Oral)   Ht 5' 9 (1.753 m)   Wt 170 lb 3.2 oz (77.2 kg)   SpO2 99%   BMI 25.13 kg/m   Wt Readings from Last 5 Encounters:  06/03/24 170 lb 3.2 oz (77.2 kg)  05/15/24 168 lb 6.4 oz (76.4 kg)  05/13/24 169 lb 6.4 oz (76.8 kg)  04/10/24 169 lb 9.6 oz (76.9 kg)  10/28/23 169 lb (76.7 kg)    BMI Readings from Last 5 Encounters:  06/03/24 25.13 kg/m  05/15/24 24.87 kg/m  05/13/24 25.02 kg/m  04/10/24 25.05 kg/m  10/28/23 24.96 kg/m     Physical Exam General: Sitting in bed, no acute distress Eyes: EOMI, no icterus Neck: Supple, no JVP Pulmonary: Clear, normal work of breathing Cardiovascular: Warm, no edema Abdomen: Nondistended MSK: No synovitis, no joint effusion Neuro: Normal gait, no weakness Psych: Normal mood, full affect  Assessment & Plan:   Lingular opacity, scattered nodules: Suspect related to some inflammatory, likely infectious etiology.  Treated with antibiotics,  azithromycin .  Will repeat imaging now to ensure resolution, CT ordered.  Reported history of asthma: PFTs 2016 within normal limits.  Seen by pulmonary most recently 2016 with diagnosis of asthma.  Chest x-ray hyperinflated May 2016 fits with small airways disease, asthma.  Recurrent respiratory illnesses likely bronchitis versus less likely pneumonia.  Fortunately less frequent although present since 2018.  Minimal day-to-day symptoms.  Albuterol  as needed refilled today.  Consider ICS/LABA therapy in the future if episodes of bronchitis etc. become more prevalent in the coming weeks to months.  Healthcare maintenance: She has received her flu shot.  Prevnar 20 recommended and she agreed to administration.  Unfortunately we had no supply in the office and was unable to give vaccination.   Return in about 6 months (around 12/02/2024) for f/u Dr. Annella.   Donnice JONELLE Annella, MD 06/03/2024   This appointment required 61 minutes of patient care (this includes precharting, chart review, review of results, face-to-face care, etc.).

## 2024-06-30 ENCOUNTER — Ambulatory Visit (HOSPITAL_COMMUNITY)
Admission: RE | Admit: 2024-06-30 | Discharge: 2024-06-30 | Disposition: A | Discharge status: Home / Self Care | Payer: Self-pay | Source: Ambulatory Visit | Attending: Pulmonary Disease | Admitting: Pulmonary Disease

## 2024-06-30 DIAGNOSIS — R911 Solitary pulmonary nodule: Secondary | ICD-10-CM | POA: Diagnosis present

## 2024-07-04 ENCOUNTER — Other Ambulatory Visit: Payer: Self-pay | Admitting: Rheumatology

## 2024-07-04 DIAGNOSIS — M3509 Sicca syndrome with other organ involvement: Secondary | ICD-10-CM

## 2024-07-06 NOTE — Telephone Encounter (Signed)
 Last Fill: 04/10/2024  Next Visit: 11/11/2024  Last Visit: 05/13/2024  DX: Sjogren's syndrome with other organ involvement   Current Dose per office note on 05/13/2024: pilocarpine  5 mg twice daily to 3 times daily.   Okay to refill pilocarpine ?

## 2024-07-10 ENCOUNTER — Ambulatory Visit: Payer: Self-pay | Admitting: Pulmonary Disease

## 2024-08-07 ENCOUNTER — Encounter: Payer: Self-pay | Admitting: Family Medicine

## 2024-09-02 ENCOUNTER — Ambulatory Visit: Payer: Self-pay

## 2024-09-02 NOTE — Telephone Encounter (Signed)
 FYI Only or Action Required?: FYI only for provider: appointment scheduled on 09/08/24.  Patient was last seen in primary care on 05/15/2024 by Duanne Butler DASEN, MD.  Called Nurse Triage reporting Back Pain.  Symptoms began several months ago.  Interventions attempted: OTC medications: Advil .  Symptoms are: gradually worsening.  Triage Disposition: See PCP When Office is Open (Within 3 Days)  Patient/caregiver understands and will follow disposition?: Yes        Message from Shanda MATSU sent at 09/02/2024  1:26 PM EST   Reason for Triage: Patient is reporting ongoing back pain that started getting worse as of 06/2024, stated pain level can range anywhere between 6-8    Reason for Disposition  [1] MODERATE back pain (e.g., interferes with normal activities) AND [2] present > 3 days  Answer Assessment - Initial Assessment Questions 1. ONSET: When did the pain begin? (e.g., minutes, hours, days)     November 2025  2. LOCATION: Where does it hurt? (upper, mid or lower back)     lower back  3. SEVERITY: How bad is the pain?  (e.g., Scale 1-10; mild, moderate, or severe)     Moderate  4. PATTERN: Is the pain constant? (e.g., yes, no; constant, intermittent)      Intermittent   5. RADIATION: Does the pain shoot into your legs or somewhere else?     Upper left buttock   6. CAUSE:  What do you think is causing the back pain?      Suspected bulging disk; sciatic pain.    7. BACK OVERUSE:  Any recent lifting of heavy objects, strenuous work or exercise?     No   8. MEDICINES: What have you taken so far for the pain? (e.g., nothing, acetaminophen , NSAIDS)     Advil    9. NEUROLOGIC SYMPTOMS: Do you have any weakness, numbness, or problems with bowel/bladder control?     No   10. OTHER SYMPTOMS: Do you have any other symptoms? (e.g., fever, abdomen pain, burning with urination, blood in urine)  No     Patient called in to triage with complaints of lower  back pain  This has been ongoing since 2019, but pain has returned. She has a hx. Of bulging disks, and suspects this is sciatic pain.  For home care, the patient is taking Advil .   Soonest Appointment scheduled for further evaluation on 1/13; Patient agrees with the plan of care, and will reach out if symptoms worsen or persist.  Protocols used: Back Pain-A-AH

## 2024-09-02 NOTE — Telephone Encounter (Signed)
" ° ° ° ° ° °  Message from Shanda MATSU sent at 09/02/2024  1:26 PM EST  Reason for Triage: Patient is reporting ongoing back pain that started getting worse as of 06/2024, stated pain level can range anywhere between 6-8   "

## 2024-09-02 NOTE — Telephone Encounter (Signed)
 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

## 2024-09-08 ENCOUNTER — Ambulatory Visit: Admitting: Family Medicine

## 2024-09-08 ENCOUNTER — Encounter: Payer: Self-pay | Admitting: Family Medicine

## 2024-09-08 VITALS — BP 124/72 | HR 84 | Ht 69.0 in | Wt 175.4 lb

## 2024-09-08 DIAGNOSIS — M545 Low back pain, unspecified: Secondary | ICD-10-CM

## 2024-09-08 DIAGNOSIS — G8929 Other chronic pain: Secondary | ICD-10-CM

## 2024-09-08 MED ORDER — PREDNISONE 10 MG (21) PO TBPK: ORAL_TABLET | ORAL | 0 refills | Status: AC

## 2024-09-08 MED ORDER — CYCLOBENZAPRINE HCL 5 MG PO TABS: 5.0000 mg | ORAL_TABLET | Freq: Three times a day (TID) | ORAL | 1 refills | Status: AC | PRN

## 2024-09-08 MED ORDER — CELECOXIB 100 MG PO CAPS: 100.0000 mg | ORAL_CAPSULE | Freq: Two times a day (BID) | ORAL | 0 refills | Status: AC

## 2024-09-08 NOTE — Progress Notes (Signed)
 "  Patient Office Visit  Assessment & Plan:  Chronic left-sided low back pain without sciatica -     predniSONE ; Use as directed.  Dispense: 21 each; Refill: 0 -     Cyclobenzaprine  HCl; Take 1 tablet (5 mg total) by mouth 3 (three) times daily as needed for muscle spasms.  Dispense: 30 tablet; Refill: 1 -     DG Lumbar Spine 2-3 Views; Future -     Celecoxib ; Take 1 capsule (100 mg total) by mouth 2 (two) times daily. Take with food  Dispense: 60 capsule; Refill: 0   Assessment and Plan       Chronic left-sided low back pain with acute exacerbation Pain exacerbated by recent activities, rated 2-3/10, persistent, worsens with sitting, improves with standing. 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 Differential includes muscle spasm and exacerbation of disc issues. No significant nerve pain, no current need for gabapentin . Discussed physical therapy and chiropractic care, awaiting x-ray results. - Ordered lumbar spine x-ray to assess for changes or new issues. - Prescribed prednisone  taper to reduce inflammationx 6 days. - Prescribed Celebrex  100 mg twice daily post-prednisone , with food. - Advised not to continue with Advil  since she will try Celebrex  - Encouraged stretching exercises and yoga for mobility.         No follow-ups on file.   Subjective:    Patient ID: Diane Lee, female    DOB: 12-10-1980  Age: 44 y.o. MRN: [id number]  Chief Complaint  Patient presents with   Back Pain    On and off center lower back pain since November. Pt states that in March of 2019, she had an MRI that showed a bulging disc. She states that the back pain flared up after going to [employer]ow[employer]s and riding roller coasters.     Back Pain   Discussed the use of AI scribe software for clinical note transcription with the patient, who gave verbal consent to proceed.  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 Initially, she had numbness and tingling down her left leg, but she does not currently experience these symptoms. The pain is recurrent, predominantly on the left side, and is described as dull and persistent.  In 2022, she fell down the stairs, impacting her coccyx, which caused pain for about six months, though she did not seek medical evaluation. The back pain did not flare up at that time. However, in November 2025, after riding roller coasters, her back pain returned and worsened. It improved slightly before Christmas but was exacerbated again in early January 2026 while making her daughter's bed.  Currently, she describes the pain as dull and constant, rating it as a 2-3 out of 10. She takes 600 mg of Advil  two to three times a day, which provides some relief. She experiences difficulty sleeping due to pain when turning over, though this has improved recently. No weakness, incontinence, or radicular symptoms at present.  She finds standing and walking to be more comfortable than sitting, which exacerbates her pain if prolonged. She can drive and get in and out of the  car without significant issues. The pain is described as deep and not muscular.  Her past treatment in 2019 included muscle relaxants and gabapentin , which were helpful at the time. She has not recently engaged in physical therapy or chiropractic care but did so in the past with some improvement. She has not taken Celebrex  or other anti-inflammatories besides Advil .  Physical Exam MUSCULOSKELETAL: Pain on forward flexion and extension of the spine. No pain on lateral flexion of the spine. Leg strength intact. No pain on leg lift. Pain on leg extension. No pain on tiptoe  walking.  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 Differential includes muscle spasm and exacerbation of disc issues. No significant nerve pain, no current need for gabapentin . Discussed physical therapy and chiropractic care, awaiting x-ray results. - Ordered lumbar spine x-ray to assess for changes or new issues. - Prescribed prednisone  taper to reduce inflammation. - Prescribed Celebrex  100 mg twice daily post-prednisone , with food. - Encouraged stretching exercises and yoga for mobility.    The 10-year ASCVD risk score (Arnett DK, et al., 2019) is: 0.5%  Past Medical History:  Diagnosis Date   Abnormal Pap smear    Arrhythmia A few years ago   Im not aure if this is what it is. My heart speeds up occasionally, usuallyonly lasting a few beats, but there have been longer episodes   Asthma 2016   Inhaler is only needed occassionally during allergy season   Leukopenia    PSVT (paroxysmal supraventricular tachycardia)    Sjogren's syndrome    Past Surgical History:  Procedure Laterality Date   CERVICAL BIOPSY     Benign   TONSILLECTOMY     VAGINAL DELIVERY     WISDOM TOOTH EXTRACTION     Social History[1] Family History  Problem Relation Age of Onset   Miscarriages / Stillbirths Mother    Prostate cancer Father    Heart disease Father    Mitral valve prolapse Father    Diabetes Father    Healthy Sister    Breast cancer Maternal Grandmother    Arthritis Maternal Grandmother    Healthy Son    Healthy Daughter    Arrhythmia Paternal Aunt    Allergies[2]  Review of Systems   Musculoskeletal:  Positive for back pain.      Objective:    BP 124/72   Pulse 84   Ht 5' 9 (1.753 m)   Wt 175 lb 6 oz (79.5 kg)   SpO2 99%   BMI 25.90 kg/m  BP Readings from Last 3 Encounters:  09/08/24 124/72  06/03/24 115/82  05/15/24 124/62   Wt Readings from Last 3 Encounters:  09/08/24 175 lb 6 oz (79.5 kg)  06/03/24 170 lb 3.2 oz (77.2 kg)  05/15/24 168 lb 6.4 oz (76.4 kg)    Physical Exam Vitals and nursing note reviewed.  Constitutional:      General: She is not in acute distress.    Appearance: Normal appearance.  HENT:     Head: Normocephalic.  Eyes:     Extraocular Movements: Extraocular movements intact.     Conjunctiva/sclera: Conjunctivae normal.     Pupils:  Pupils are equal, round, and reactive to light.  Musculoskeletal:     Lumbar back: Spasms, tenderness and bony tenderness present. Normal range of motion. Negative right straight leg raise test and negative left straight leg raise test.     Right lower leg: No edema.     Left lower leg: No edema.     Comments: Patient is able to walk on toes/heels, can do forward flexion and extension but has discomfort left lower back. Tender to palpation over left lower back  Neurological:     General: No focal deficit present.     Mental Status: She is alert and oriented to person, place, and time.     Gait: Gait is intact.     Deep Tendon Reflexes: Reflexes are normal and symmetric.     Comments: Patient can get on exam table without assistance  Psychiatric:        Mood and Affect: Mood normal.        Behavior: Behavior normal.      No results found for any visits on 09/08/24.          [1]  Social History Tobacco Use   Smoking status: Never    Passive exposure: Never   Smokeless tobacco: Never  Vaping Use   Vaping status: Never Used  Substance Use Topics   Alcohol use: No   Drug use: No  [2]  Allergies Allergen Reactions   Sulfa Antibiotics Other (See Comments)    Joint pain     Bactrim Other (See Comments)    Pt states that this med causes joint pain, and her white blood cells and platelets to drop.   "

## 2024-09-09 ENCOUNTER — Ambulatory Visit (HOSPITAL_COMMUNITY)
Admission: RE | Admit: 2024-09-09 | Discharge: 2024-09-09 | Disposition: A | Discharge status: Home / Self Care | Source: Ambulatory Visit | Attending: Family Medicine | Admitting: Family Medicine

## 2024-09-09 ENCOUNTER — Ambulatory Visit: Payer: Self-pay | Admitting: Family Medicine

## 2024-09-09 DIAGNOSIS — G8929 Other chronic pain: Secondary | ICD-10-CM | POA: Diagnosis present

## 2024-09-09 DIAGNOSIS — M545 Low back pain, unspecified: Secondary | ICD-10-CM | POA: Insufficient documentation

## 2024-09-10 ENCOUNTER — Other Ambulatory Visit: Payer: Self-pay | Admitting: Medical Genetics

## 2024-09-10 DIAGNOSIS — Z006 Encounter for examination for normal comparison and control in clinical research program: Secondary | ICD-10-CM

## 2024-11-11 ENCOUNTER — Ambulatory Visit: Admitting: Rheumatology
